# Patient Record
Sex: Female | Born: 1970 | Race: Black or African American | Hispanic: No | Smoking: Current every day smoker
Health system: Southern US, Community
[De-identification: ages and names within clinical notes are randomized; demographics above are authoritative.]

---

## 1998-11-14 ENCOUNTER — Emergency Department (HOSPITAL_COMMUNITY): Admission: EM | Admit: 1998-11-14 | Discharge: 1998-11-14 | Payer: Self-pay | Admitting: Emergency Medicine

## 2006-03-05 ENCOUNTER — Emergency Department (HOSPITAL_COMMUNITY): Admission: EM | Admit: 2006-03-05 | Discharge: 2006-03-05 | Payer: Self-pay | Admitting: Emergency Medicine

## 2006-03-09 ENCOUNTER — Ambulatory Visit: Payer: Self-pay | Admitting: Nurse Practitioner

## 2006-03-10 ENCOUNTER — Ambulatory Visit: Payer: Self-pay | Admitting: *Deleted

## 2006-05-10 ENCOUNTER — Ambulatory Visit: Payer: Self-pay | Admitting: Nurse Practitioner

## 2006-07-13 ENCOUNTER — Emergency Department (HOSPITAL_COMMUNITY): Admission: EM | Admit: 2006-07-13 | Discharge: 2006-07-13 | Payer: Self-pay | Admitting: Emergency Medicine

## 2007-04-21 IMAGING — CT CT HEAD W/O CM
1 series · 16 of 30 positions shown, 20 images · IV contrast (agent unspecified)
Comparison: None

CLINICAL DATA: Headache

HEAD CT WITHOUT CONTRAST:
TECHNIQUE: 5mm collimated images were obtained from the base of the skull
through the vertex according to standard protocol without contrast.

[Series 2: head_seq 4.5 h45s st · axial · 0.43mm/px · z∈[-193,-49]mm · 16 of 36 slices shown, 20 images]
[im 2/36  brain]
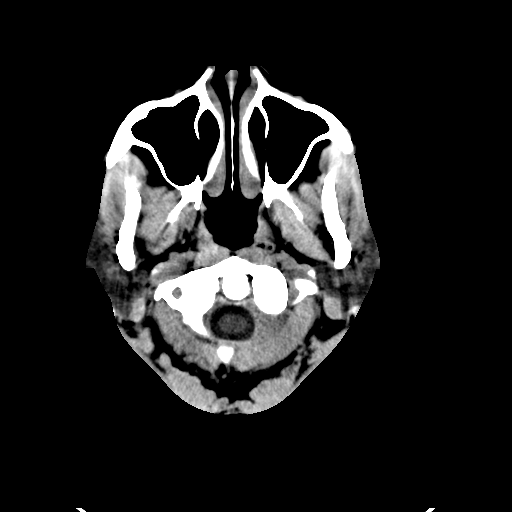
[im 2/36  bone]
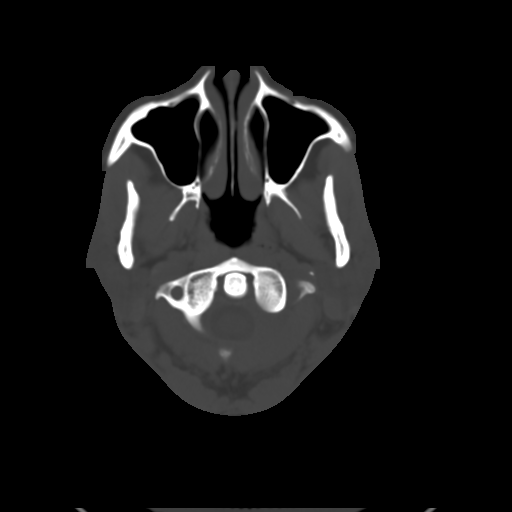
[im 4/36  brain]
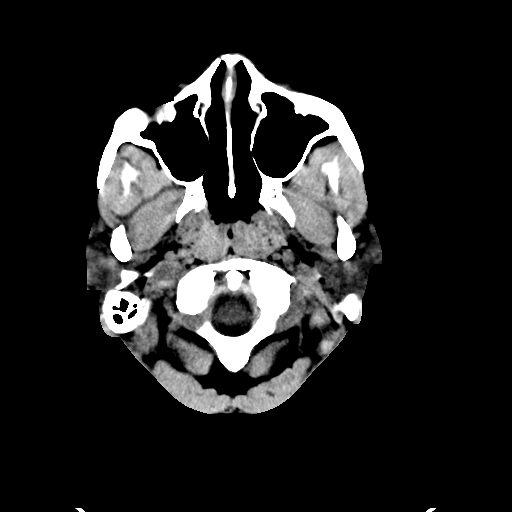
[im 7/36  brain]
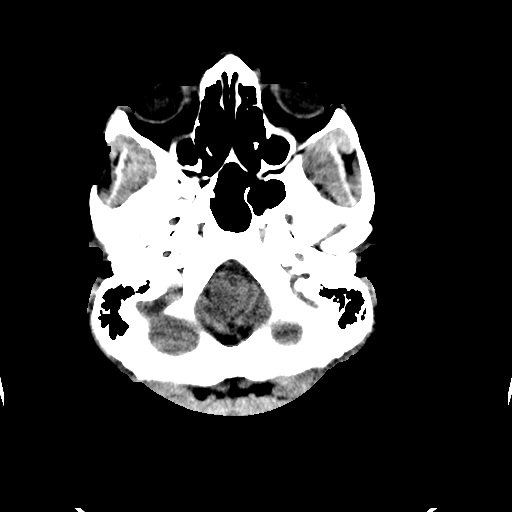
[im 9/36  brain]
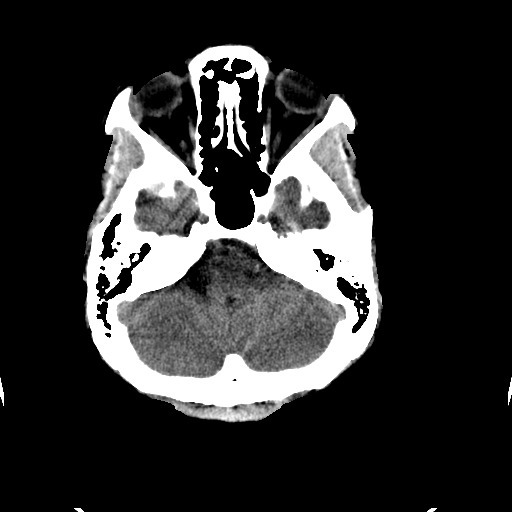
[im 10/36  brain]
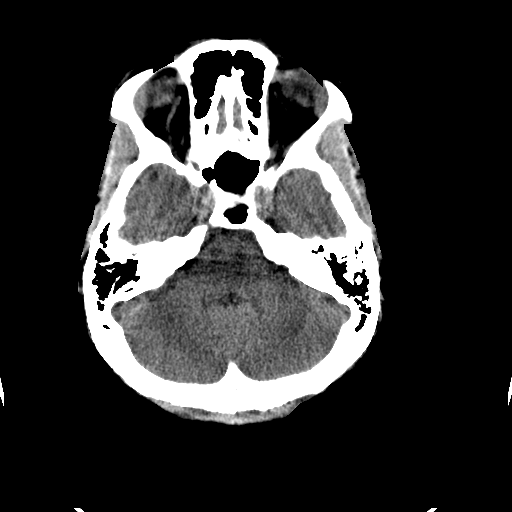
[im 10/36  bone]
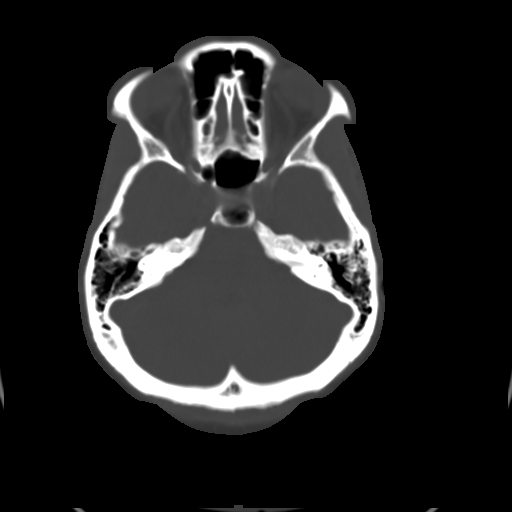
[im 13/36  brain]
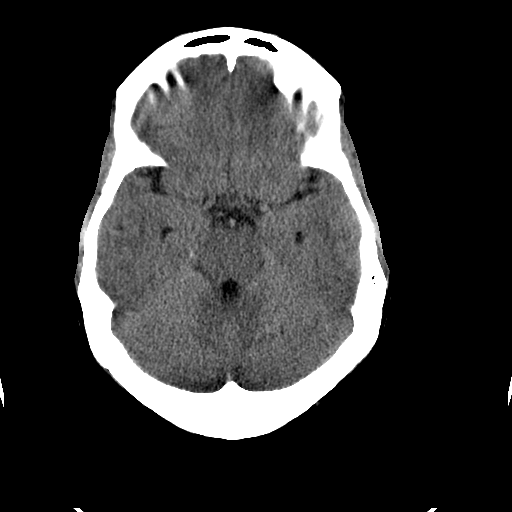
[im 15/36  brain]
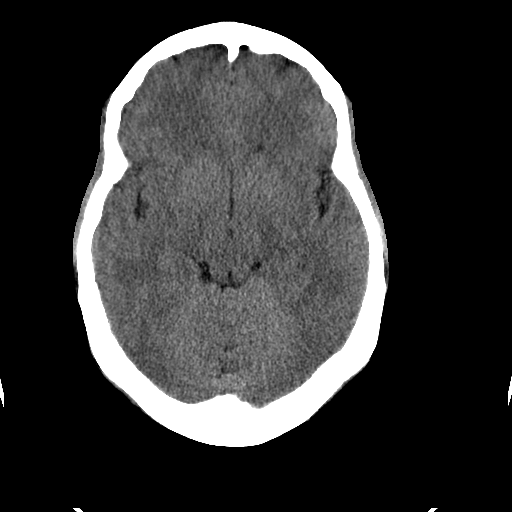
[im 17/36  brain]
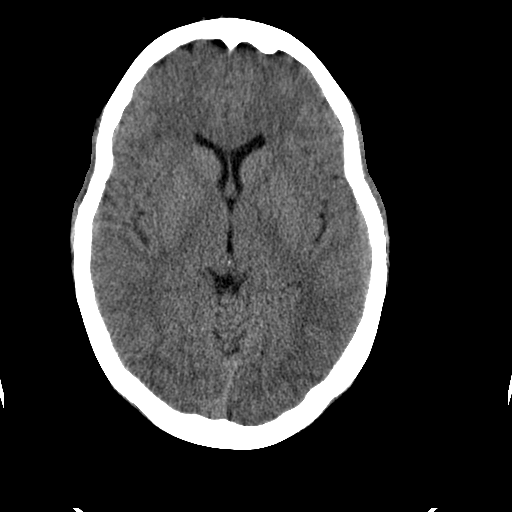
[im 19/36  brain]
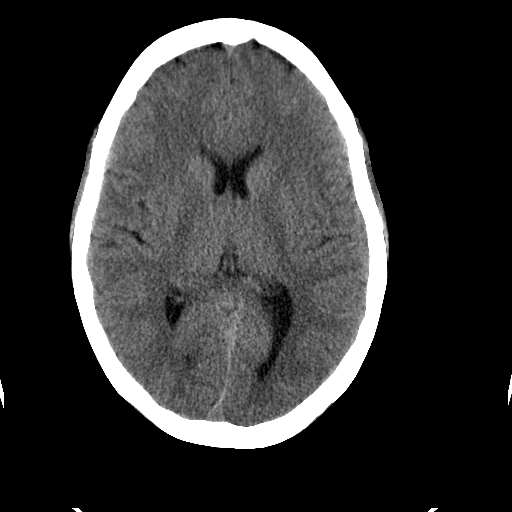
[im 19/36  bone]
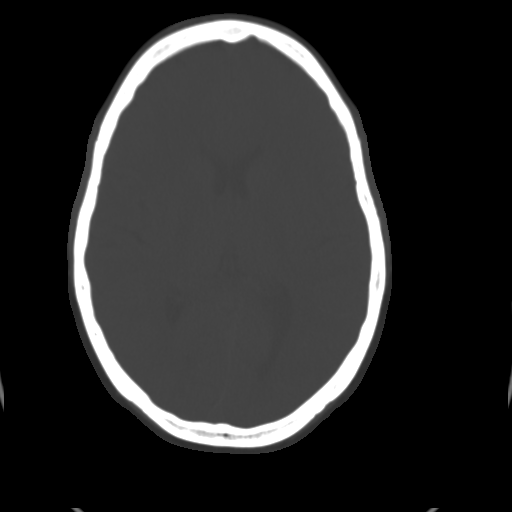
[im 21/36  brain]
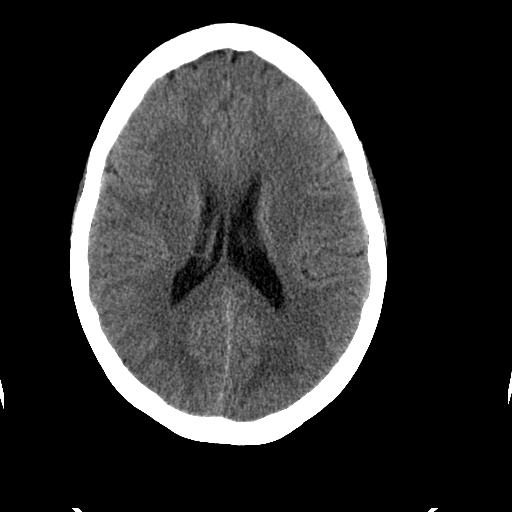
[im 23/36  brain]
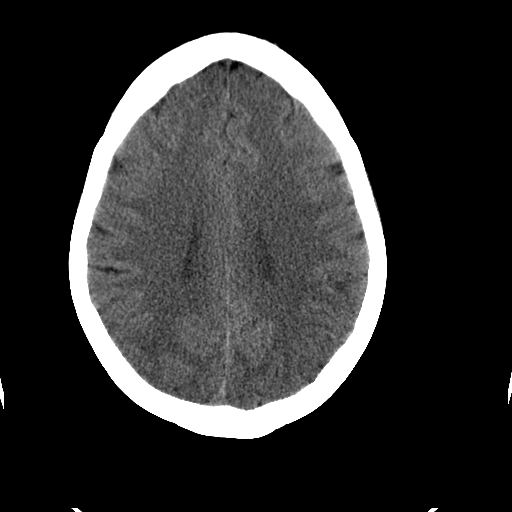
[im 26/36  brain]
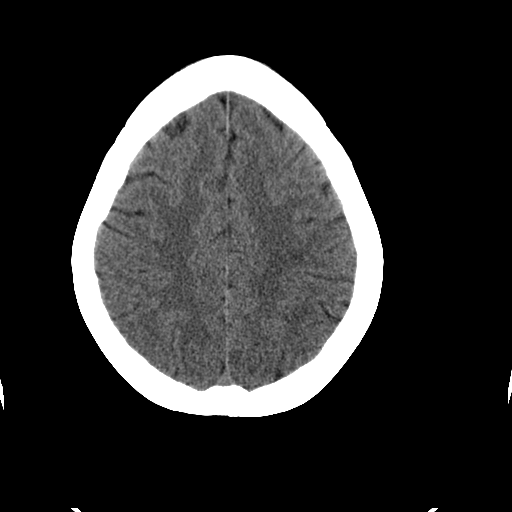
[im 27/36  brain]
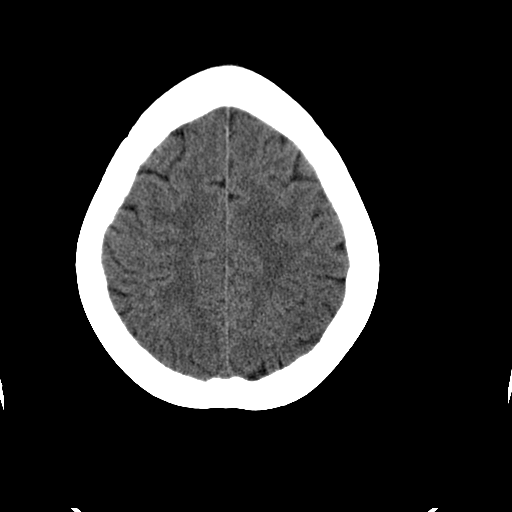
[im 27/36  bone]
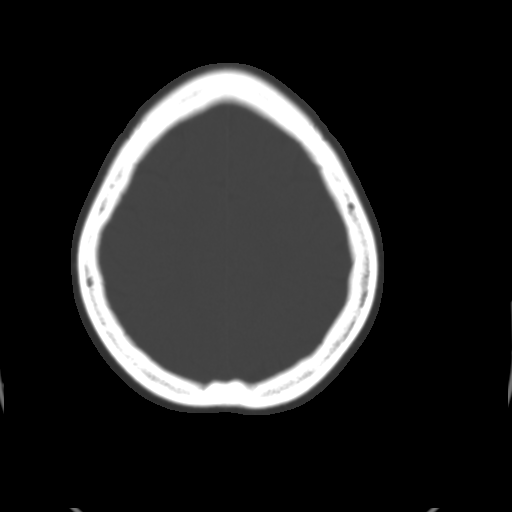
[im 29/36  brain]
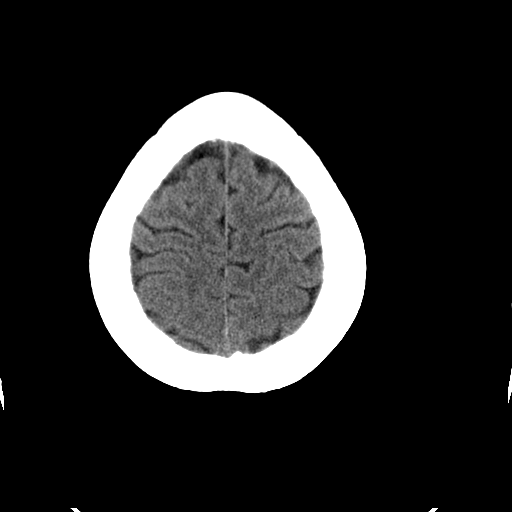
[im 32/36  brain]
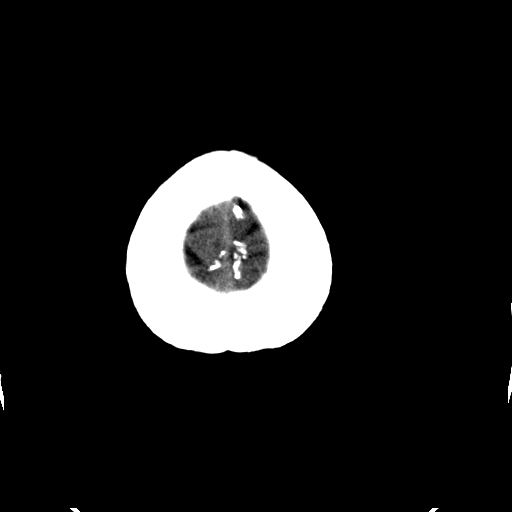
[im 34/36  brain]
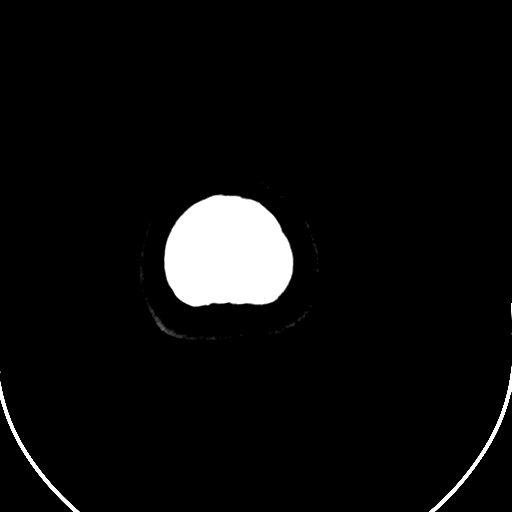

[16 of 30 positions shown; findings below may reference images not displayed]

FINDINGS: There is no evidence of intracranial hemorrhage, brain edema, or mass
effect.  No other intra-axial abnormalities are seen, and the ventricles are
within normal limits.  No abnormal extra-axial fluid collections or masses are
identified.  No skull abnormalities are noted.
IMPRESSION: No acute intracranial abnormality.
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
## 2008-02-21 ENCOUNTER — Emergency Department (HOSPITAL_COMMUNITY): Admission: EM | Admit: 2008-02-21 | Discharge: 2008-02-21 | Payer: Self-pay | Admitting: Emergency Medicine

## 2008-03-21 ENCOUNTER — Ambulatory Visit (HOSPITAL_COMMUNITY): Admission: RE | Admit: 2008-03-21 | Discharge: 2008-03-21 | Payer: Self-pay | Admitting: Chiropractic Medicine

## 2016-03-25 ENCOUNTER — Other Ambulatory Visit: Payer: Self-pay | Admitting: Physician Assistant

## 2018-10-03 DIAGNOSIS — H16141 Punctate keratitis, right eye: Secondary | ICD-10-CM | POA: Diagnosis not present

## 2018-10-03 DIAGNOSIS — H20021 Recurrent acute iridocyclitis, right eye: Secondary | ICD-10-CM | POA: Diagnosis not present

## 2018-10-03 DIAGNOSIS — H1820 Unspecified corneal edema: Secondary | ICD-10-CM | POA: Diagnosis not present

## 2018-10-06 DIAGNOSIS — H16141 Punctate keratitis, right eye: Secondary | ICD-10-CM | POA: Diagnosis not present

## 2019-04-22 DIAGNOSIS — H109 Unspecified conjunctivitis: Secondary | ICD-10-CM | POA: Diagnosis not present

## 2019-07-21 ENCOUNTER — Other Ambulatory Visit: Payer: Self-pay

## 2019-07-24 ENCOUNTER — Ambulatory Visit: Payer: BC Managed Care – PPO | Admitting: Women's Health

## 2019-07-24 ENCOUNTER — Other Ambulatory Visit: Payer: Self-pay

## 2019-07-24 ENCOUNTER — Encounter: Payer: Self-pay | Admitting: Women's Health

## 2019-07-24 VITALS — BP 128/80 | Ht 65.0 in | Wt 121.0 lb

## 2019-07-24 DIAGNOSIS — N921 Excessive and frequent menstruation with irregular cycle: Secondary | ICD-10-CM

## 2019-07-24 DIAGNOSIS — Z113 Encounter for screening for infections with a predominantly sexual mode of transmission: Secondary | ICD-10-CM

## 2019-07-24 DIAGNOSIS — Z13220 Encounter for screening for lipoid disorders: Secondary | ICD-10-CM | POA: Diagnosis not present

## 2019-07-24 DIAGNOSIS — Z01419 Encounter for gynecological examination (general) (routine) without abnormal findings: Secondary | ICD-10-CM | POA: Diagnosis not present

## 2019-07-24 DIAGNOSIS — F17200 Nicotine dependence, unspecified, uncomplicated: Secondary | ICD-10-CM | POA: Insufficient documentation

## 2019-07-24 LAB — WET PREP FOR TRICH, YEAST, CLUE

## 2019-07-24 MED ORDER — METRONIDAZOLE 500 MG PO TABS: ORAL_TABLET | ORAL | 1 refills | Status: DC

## 2019-07-24 NOTE — Patient Instructions (Addendum)
[facility]  [phone]  Dr Carlean Purl  Trichomoniasis Trichomoniasis is an STI (sexually transmitted infection) that can affect both women and men. In women, the outer area of the female genitalia (vulva) and the vagina are affected. In men, mainly the penis is affected, but the prostate and other reproductive organs can also be involved.  This condition can be treated with medicine. It often has no symptoms (is asymptomatic), especially in men. If not treated, trichomoniasis can last for months or years. What are the causes? This condition is caused by a parasite called Trichomonas vaginalis. Trichomoniasis most often spreads from person to person (is contagious) through sexual contact. What increases the risk? The following factors may make you more likely to develop this condition:  Having unprotected sex.  Having sex with a partner who has trichomoniasis.  Having multiple sexual partners.  Having had previous trichomoniasis infections or other STIs. What are the signs or symptoms? In women, symptoms of trichomoniasis include:  Abnormal vaginal discharge that is clear, white, gray, or yellow-green and foamy and has an unusual "fishy" odor.  Itching and irritation of the vagina and vulva.  Burning or pain during urination or sex.  Redness and swelling of the genitals. In men, symptoms of trichomoniasis include:  Penile discharge that may be foamy or contain pus.  Pain in the penis. This may happen only when urinating.  Itching or irritation inside the penis.  Burning after urination or ejaculation. How is this diagnosed? In women, this condition may be found during a routine Pap test or physical exam. It may be found in men during a routine physical exam. Your health care provider may do tests to help diagnose this infection, such as:  Urine tests (men and women).  The following in women: ? Testing the pH of the vagina. ? A vaginal swab test that checks for the Trichomonas  vaginalis parasite. ? Testing vaginal secretions. Your health care provider may test you for other STIs, including HIV (human immunodeficiency virus). How is this treated? This condition is treated with medicine taken by mouth (orally), such as metronidazole or tinidazole, to fight the infection. Your sexual partner(s) also need to be tested and treated.  If you are a woman and you plan to become pregnant or think you may be pregnant, tell your health care provider right away. Some medicines that are used to treat the infection should not be taken during pregnancy. Your health care provider may recommend over-the-counter medicines or creams to help relieve itching or irritation. You may be tested for infection again 3 months after treatment. Follow these instructions at home:  Take and use over-the-counter and prescription medicines, including creams, only as told by your health care provider.  Take your antibiotic medicine as told by your health care provider. Do not stop taking the antibiotic even if you start to feel better.  Do not have sex until 7-10 days after you finish your medicine, or until your health care provider approves. Ask your health care provider when you may start to have sex again.  (Women) Do not douche or wear tampons while you have the infection.  Discuss your infection with your sexual partner(s). Make sure that your partner gets tested and treated, if necessary.  Keep all follow-up visits as told by your health care provider. This is important. How is this prevented?   Use condoms every time you have sex. Using condoms correctly and consistently can help protect against STIs.  Avoid having multiple sexual partners.  Talk with your sexual partner about any symptoms that either of you may have, as well as any history of STIs.  Get tested for STIs and STDs (sexually transmitted diseases) before you have sex. Ask your partner to do the same.  Do not have sexual  contact if you have symptoms of trichomoniasis or another STI. Contact a health care provider if:  You still have symptoms after you finish your medicine.  You develop pain in your abdomen.  You have pain when you urinate.  You have bleeding after sex.  You develop a rash.  You feel nauseous or you vomit.  You plan to become pregnant or think you may be pregnant. Summary  Trichomoniasis is an STI (sexually transmitted infection) that can affect both women and men.  This condition often has no symptoms (is asymptomatic), especially in men.  Without treatment, this condition can last for months or years.  You should not have sex until 7-10 days after you finish your medicine, or until your health care provider approves. Ask your health care provider when you may start to have sex again.  Discuss your infection with your sexual partner(s). Make sure that your partner gets tested and treated, if necessary. This information is not intended to replace advice given to you by your health care provider. Make sure you discuss any questions you have with your health care provider. Document Released: 03/17/2001 Document Revised: 07/05/2018 Document Reviewed: 07/05/2018 Elsevier Patient Education  2020 [employer].  Health Maintenance, Female Adopting a healthy lifestyle and getting preventive care are important in promoting health and wellness. Ask your health care provider about:  The right schedule for you to have regular tests and exams.  Things you can do on your own to prevent diseases and keep yourself healthy. What should I know about diet, weight, and exercise? Eat a healthy diet   Eat a diet that includes plenty of vegetables, fruits, low-fat dairy products, and lean protein.  Do not eat a lot of foods that are high in solid fats, added sugars, or sodium. Maintain a healthy weight Body mass index (BMI) is used to identify weight problems. It estimates body fat based on  height and weight. Your health care provider can help determine your BMI and help you achieve or maintain a healthy weight. Get regular exercise Get regular exercise. This is one of the most important things you can do for your health. Most adults should:  Exercise for at least 150 minutes each week. The exercise should increase your heart rate and make you sweat (moderate-intensity exercise).  Do strengthening exercises at least twice a week. This is in addition to the moderate-intensity exercise.  Spend less time sitting. Even light physical activity can be beneficial. Watch cholesterol and blood lipids Have your blood tested for lipids and cholesterol at 49 years of age, then have this test every 5 years. Have your cholesterol levels checked more often if:  Your lipid or cholesterol levels are high.  You are older than 49 years of age.  You are at high risk for heart disease. What should I know about cancer screening? Depending on your health history and family history, you may need to have cancer screening at various ages. This may include screening for:  Breast cancer.  Cervical cancer.  Colorectal cancer.  Skin cancer.  Lung cancer. What should I know about heart disease, diabetes, and high blood pressure? Blood pressure and heart disease  High blood pressure causes heart disease and  increases the risk of stroke. This is more likely to develop in people who have high blood pressure readings, are of African descent, or are overweight.  Have your blood pressure checked: ? Every 3-5 years if you are 6518-49 years of age. ? Every year if you are 49 years old or older. Diabetes Have regular diabetes screenings. This checks your fasting blood sugar level. Have the screening done:  Once every three years after age 49 if you are at a normal weight and have a low risk for diabetes.  More often and at a younger age if you are overweight or have a high risk for diabetes. What  should I know about preventing infection? Hepatitis B If you have a higher risk for hepatitis B, you should be screened for this virus. Talk with your health care provider to find out if you are at risk for hepatitis B infection. Hepatitis C Testing is recommended for:  Everyone born from [id number] through 1966.  Anyone with known risk factors for hepatitis C. Sexually transmitted infections (STIs)  Get screened for STIs, including gonorrhea and chlamydia, if: ? You are sexually active and are younger than 49 years of age. ? You are older than 49 years of age and your health care provider tells you that you are at risk for this type of infection. ? Your sexual activity has changed since you were last screened, and you are at increased risk for chlamydia or gonorrhea. Ask your health care provider if you are at risk.  Ask your health care provider about whether you are at high risk for HIV. Your health care provider may recommend a prescription medicine to help prevent HIV infection. If you choose to take medicine to prevent HIV, you should first get tested for HIV. You should then be tested every 3 months for as long as you are taking the medicine. Pregnancy  If you are about to stop having your period (premenopausal) and you may become pregnant, seek counseling before you get pregnant.  Take 400 to 800 micrograms (mcg) of folic acid every day if you become pregnant.  Ask for birth control (contraception) if you want to prevent pregnancy. Osteoporosis and menopause Osteoporosis is a disease in which the bones lose minerals and strength with aging. This can result in bone fractures. If you are 49 years old or older, or if you are at risk for osteoporosis and fractures, ask your health care provider if you should:  Be screened for bone loss.  Take a calcium or vitamin D supplement to lower your risk of fractures.  Be given hormone replacement therapy (HRT) to treat symptoms of  menopause. Follow these instructions at home: Lifestyle  Do not use any products that contain nicotine or tobacco, such as cigarettes, e-cigarettes, and chewing tobacco. If you need help quitting, ask your health care provider.  Do not use street drugs.  Do not share needles.  Ask your health care provider for help if you need support or information about quitting drugs. Alcohol use  Do not drink alcohol if: ? Your health care provider tells you not to drink. ? You are pregnant, may be pregnant, or are planning to become pregnant.  If you drink alcohol: ? Limit how much you use to 0-1 drink a day. ? Limit intake if you are breastfeeding.  Be aware of how much alcohol is in your drink. In the [place] one drink equals one 12 oz bottle of beer (355 mL), one 5 oz  glass of wine (148 mL), or one 1 oz glass of hard liquor (44 mL). General instructions  Schedule regular health, dental, and eye exams.  Stay current with your vaccines.  Tell your health care provider if: ? You often feel depressed. ? You have ever been abused or do not feel safe at home. Summary  Adopting a healthy lifestyle and getting preventive care are important in promoting health and wellness.  Follow your health care provider's instructions about healthy diet, exercising, and getting tested or screened for diseases.  Follow your health care provider's instructions on monitoring your cholesterol and blood pressure. This information is not intended to replace advice given to you by your health care provider. Make sure you discuss any questions you have with your health care provider. Document Released: 04/06/2011 Document Revised: 09/14/2018 Document Reviewed: 09/14/2018 Elsevier Patient Education  2020 [employer].

## 2019-07-24 NOTE — Progress Notes (Signed)
Kristin Long 27-Nov-1970 [id number]    History:    Presents for annual exam.  Regular monthly 5 to 6-day cycle until September cycle lasted 2 weeks.  BTL.  Same partner for about 2 years.  Reports normal Pap history but has not had one for years.  Has not had a screening mammogram.  Smokes less than 1/2 pack daily.  Past medical history, past surgical history, family history and social history were all reviewed and documented in the EPIC chart.  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  3 children all in their 41s has 7 grandchildren all doing well.  Daughter lives in [place] 5 sons, 1 set of twins, son lives in [place] twin daughters.  Mother diabetes.  Originally from [place].  ROS:  A ROS was performed and pertinent positives and negatives are included.  Exam:  Vitals:   07/24/19 1147  BP: 128/80  Weight: 121 lb (54.9 kg)  Height: 5\' 5"  (1.651 m)   Body mass index is 20.14 kg/m.   General appearance:  Normal Thyroid:  Symmetrical, normal in size, without palpable masses or nodularity. Respiratory  Auscultation:  Clear without wheezing or rhonchi Cardiovascular  Auscultation:  Regular rate, without rubs, murmurs or gallops  Edema/varicosities:  Not grossly evident Abdominal  Soft,nontender, without masses, guarding or rebound.  Liver/spleen:  No organomegaly noted  Hernia:  None appreciated  Skin  Inspection:  Grossly normal   Breasts: Examined lying and sitting.     Right: Without masses, retractions, discharge or axillary adenopathy.     Left: Without masses, retractions, discharge or axillary adenopathy. Gentitourinary   Inguinal/mons:  Normal without inguinal adenopathy  External genitalia:  Normal  BUS/Urethra/Skene's glands:  Normal  Vagina:  Normal bloody discharge with odor wet prep positive for trichomonas  Cervix:  Normal  Uterus:   normal in size, shape and contour.  Midline and mobile  Adnexa/parametria:     Rt: Without masses or tenderness.   Lt: Without masses  or tenderness.  Anus and perineum: Normal  Digital rectal exam: Normal sphincter tone without palpated masses or tenderness  Assessment/Plan:  48 y.o. DBF G4, P3 for annual exam with complaint of 2-week cycle in September.  Regular monthly 5 to 7-day cycles until September/BTL Trichomonas STD screen Smoker  Plan: Trichomonas discussed, Flagyl 2 g p.o. today, refill on prescription for partner to take.  Instructed to call if continued irregular bleeding.  SBEs, annual screening mammogram breast center information given instructed to schedule.  Encouraged regular exercise, vitamin D 1000 daily.  Aware of hazards of smoking, ways to decrease discussed.  CBC, CMP, lipid panel, TSH, Pap with HR HPV typing, GC/chlamydia, HIV, RPR.    [facility], 12:15 PM 07/24/2019

## 2019-07-25 LAB — PAP, TP IMAGING W/ HPV RNA, RFLX HPV TYPE 16,18/45: HPV DNA High Risk: NOT DETECTED

## 2019-07-25 LAB — CBC WITH DIFFERENTIAL/PLATELET
Absolute Monocytes: 367 cells/uL (ref 200–950)
Basophils Absolute: 31 cells/uL (ref 0–200)
Basophils Relative: 0.8 %
Eosinophils Absolute: 109 cells/uL (ref 15–500)
Eosinophils Relative: 2.8 %
HCT: 28 % — ABNORMAL LOW (ref 35.0–45.0)
Hemoglobin: 8.3 g/dL — ABNORMAL LOW (ref 11.7–15.5)
Lymphs Abs: 1810 cells/uL (ref 850–3900)
MCH: 21.2 pg — ABNORMAL LOW (ref 27.0–33.0)
MCHC: 29.6 g/dL — ABNORMAL LOW (ref 32.0–36.0)
MCV: 71.4 fL — ABNORMAL LOW (ref 80.0–100.0)
MPV: 9.9 fL (ref 7.5–12.5)
Monocytes Relative: 9.4 %
Neutro Abs: 1583 cells/uL (ref 1500–7800)
Neutrophils Relative %: 40.6 %
Platelets: 291 10*3/uL (ref 140–400)
RBC: 3.92 10*6/uL (ref 3.80–5.10)
RDW: 16.2 % — ABNORMAL HIGH (ref 11.0–15.0)
Total Lymphocyte: 46.4 %
WBC: 3.9 10*3/uL (ref 3.8–10.8)

## 2019-07-25 LAB — COMPREHENSIVE METABOLIC PANEL
AG Ratio: 1.4 (calc) (ref 1.0–2.5)
ALT: 24 U/L (ref 6–29)
AST: 29 U/L (ref 10–35)
Albumin: 4.2 g/dL (ref 3.6–5.1)
Alkaline phosphatase (APISO): 72 U/L (ref 31–125)
BUN: 8 mg/dL (ref 7–25)
CO2: 23 mmol/L (ref 20–32)
Calcium: 9.2 mg/dL (ref 8.6–10.2)
Chloride: 106 mmol/L (ref 98–110)
Creat: 0.68 mg/dL (ref 0.50–1.10)
Globulin: 2.9 g/dL (calc) (ref 1.9–3.7)
Glucose, Bld: 76 mg/dL (ref 65–99)
Potassium: 4 mmol/L (ref 3.5–5.3)
Sodium: 138 mmol/L (ref 135–146)
Total Bilirubin: 0.6 mg/dL (ref 0.2–1.2)
Total Protein: 7.1 g/dL (ref 6.1–8.1)

## 2019-07-25 LAB — LIPID PANEL
Cholesterol: 218 mg/dL — ABNORMAL HIGH (ref <200)
HDL: 75 mg/dL (ref >=50)
LDL Cholesterol (Calc): 120 mg/dL (calc) — ABNORMAL HIGH
Non-HDL Cholesterol (Calc): 143 mg/dL (calc) — ABNORMAL HIGH (ref <130)
Total CHOL/HDL Ratio: 2.9 (calc) (ref <5.0)
Triglycerides: 123 mg/dL (ref <150)

## 2019-07-25 LAB — C. TRACHOMATIS/N. GONORRHOEAE RNA
C. trachomatis RNA, TMA: NOT DETECTED
N. gonorrhoeae RNA, TMA: NOT DETECTED

## 2019-07-25 LAB — TSH: TSH: 1.67 mIU/L

## 2019-07-25 LAB — HIV ANTIBODY (ROUTINE TESTING W REFLEX): HIV 1&2 Ab, 4th Generation: NONREACTIVE

## 2019-07-25 LAB — RPR: RPR Ser Ql: NONREACTIVE

## 2019-07-27 ENCOUNTER — Emergency Department (HOSPITAL_COMMUNITY)
Admission: EM | Admit: 2019-07-27 | Discharge: 2019-07-27 | Disposition: A | Discharge status: Home / Self Care | Payer: BC Managed Care – PPO | Attending: Emergency Medicine | Admitting: Emergency Medicine

## 2019-07-27 ENCOUNTER — Other Ambulatory Visit: Payer: Self-pay

## 2019-07-27 ENCOUNTER — Encounter (HOSPITAL_COMMUNITY): Payer: Self-pay | Admitting: Emergency Medicine

## 2019-07-27 ENCOUNTER — Emergency Department (HOSPITAL_COMMUNITY): Payer: BC Managed Care – PPO

## 2019-07-27 DIAGNOSIS — M549 Dorsalgia, unspecified: Secondary | ICD-10-CM | POA: Diagnosis not present

## 2019-07-27 DIAGNOSIS — Z79899 Other long term (current) drug therapy: Secondary | ICD-10-CM | POA: Insufficient documentation

## 2019-07-27 DIAGNOSIS — F17200 Nicotine dependence, unspecified, uncomplicated: Secondary | ICD-10-CM | POA: Diagnosis not present

## 2019-07-27 DIAGNOSIS — M25519 Pain in unspecified shoulder: Secondary | ICD-10-CM | POA: Insufficient documentation

## 2019-07-27 DIAGNOSIS — R109 Unspecified abdominal pain: Secondary | ICD-10-CM | POA: Diagnosis not present

## 2019-07-27 LAB — URINALYSIS, ROUTINE W REFLEX MICROSCOPIC
Bilirubin Urine: NEGATIVE
Glucose, UA: NEGATIVE mg/dL
Hgb urine dipstick: NEGATIVE
Ketones, ur: NEGATIVE mg/dL
Leukocytes,Ua: NEGATIVE
Nitrite: NEGATIVE
Protein, ur: NEGATIVE mg/dL
Specific Gravity, Urine: 1.008 (ref 1.005–1.030)
pH: 6 (ref 5.0–8.0)

## 2019-07-27 MED ORDER — KETOROLAC TROMETHAMINE 30 MG/ML IJ SOLN
60.0000 mg | Freq: Once | INTRAMUSCULAR | Status: AC
  Administered 2019-07-27: 19:00:00 60 mg via INTRAMUSCULAR
  Filled 2019-07-27: qty 2

## 2019-07-27 MED ORDER — TRAMADOL HCL 50 MG PO TABS: 50.0000 mg | ORAL_TABLET | Freq: Four times a day (QID) | ORAL | 0 refills | Status: DC | PRN

## 2019-07-27 NOTE — ED Provider Notes (Signed)
[facility]-EMERGENCY DEPT Provider Note   CSN: [id number] Arrival date & time: 07/27/19  1202     History   Chief Complaint Chief Complaint  Patient presents with  . Flank Pain  . Vomiting  . Shoulder Pain    HPI Kristin Long is a 48 y.o. female.     Patient complains of upper back and flank pain.  Patient states is worse with movement  The history is provided by the patient. No language interpreter was used.  Flank Pain This is a new problem. The current episode started 2 days ago. The problem occurs constantly. The problem has not changed since onset.Pertinent negatives include no chest pain, no abdominal pain and no headaches. The symptoms are aggravated by bending. Nothing relieves the symptoms. She has tried nothing for the symptoms.  Shoulder Pain Associated symptoms: no back pain and no fatigue     History reviewed. No pertinent past medical history.  Patient Active Problem List   Diagnosis Date Noted  . Current every day smoker 07/24/2019    History reviewed. No pertinent surgical history.   OB History    Gravida  4   Para      Term      Preterm      AB  1   Living  3     SAB  1   TAB      Ectopic  0   Multiple      Live Births               Home Medications    Prior to Admission medications   Medication Sig Start Date End Date Taking? Authorizing Provider  metroNIDAZOLE (FLAGYL) 500 MG tablet Take 2 tablets with dinner and 2 at bedtime with food 07/24/19   Harrington Challenger, NP  traMADol (ULTRAM) 50 MG tablet Take 1 tablet (50 mg total) by mouth every 6 (six) hours as needed. 07/27/19   Bethann Berkshire, MD  VITAMIN E PO Take 1 tablet by mouth daily.    [provider]    Family History Family History  Problem Relation Age of Onset  . Diabetes Mother     Social History Social History   Tobacco Use  . Smoking status: Current Every Day Smoker  . Smokeless tobacco: Never Used  Substance Use  Topics  . Alcohol use: Yes  . Drug use: Yes    Types: Marijuana     Allergies   Patient has no known allergies.   Review of Systems Review of Systems  Constitutional: Negative for appetite change and fatigue.  HENT: Negative for congestion, ear discharge and sinus pressure.   Eyes: Negative for discharge.  Respiratory: Negative for cough.   Cardiovascular: Negative for chest pain.  Gastrointestinal: Negative for abdominal pain and diarrhea.  Genitourinary: Positive for flank pain. Negative for frequency and hematuria.  Musculoskeletal: Negative for back pain.  Skin: Negative for rash.  Neurological: Negative for seizures and headaches.  Psychiatric/Behavioral: Negative for hallucinations.     Physical Exam Updated Vital Signs BP 123/88   Pulse (!) 115   Temp 98.5 F (36.9 C) (Oral)   Resp 18   LMP 07/16/2019   SpO2 99%   Physical Exam Vitals signs and nursing note reviewed.  Constitutional:      Appearance: She is well-developed.  HENT:     Head: Normocephalic.     Nose: Nose normal.  Eyes:     General: No scleral icterus.  Conjunctiva/sclera: Conjunctivae normal.  Neck:     Musculoskeletal: Neck supple.     Thyroid: No thyromegaly.  Cardiovascular:     Rate and Rhythm: Normal rate and regular rhythm.     Heart sounds: No murmur. No friction rub. No gallop.   Pulmonary:     Breath sounds: No stridor. No wheezing or rales.  Chest:     Chest wall: No tenderness.  Abdominal:     General: There is no distension.     Tenderness: There is no abdominal tenderness. There is no rebound.  Musculoskeletal: Normal range of motion.     Comments: Tender right flank  Lymphadenopathy:     Cervical: No cervical adenopathy.  Skin:    Findings: No erythema or rash.  Neurological:     Mental Status: She is oriented to person, place, and time.     Motor: No abnormal muscle tone.     Coordination: Coordination normal.  Psychiatric:        Behavior: Behavior normal.       ED Treatments / Results  Labs (all labs ordered are listed, but only abnormal results are displayed) Labs Reviewed  URINALYSIS, ROUTINE W REFLEX MICROSCOPIC    EKG None  Radiology Dg Chest 2 View  Result Date: 07/27/2019 CLINICAL DATA:  Right-sided chest pain EXAM: CHEST - 2 VIEW COMPARISON:  03/05/2006 FINDINGS: The heart size and mediastinal contours are within normal limits. Both lungs are clear. Multiple healed right-sided rib fractures. No acute osseous findings. IMPRESSION: No active cardiopulmonary disease. Electronically Signed   By: Davina Poke M.D.   On: 07/27/2019 18:16    Procedures Procedures (including critical care time)  Medications Ordered in ED Medications  ketorolac (TORADOL) 30 MG/ML injection 60 mg (has no administration in time range)     Initial Impression / Assessment and Plan / ED Course  I have reviewed the triage vital signs and the nursing notes.  Pertinent labs & imaging results that were available during my care of the patient were reviewed by me and considered in my medical decision making (see chart for details).    Patient had CBC and chemistries a couple days ago and she did not want them repeated.  I reviewed the labs and she is anemic and noticed that.  Urinalysis negative x-ray negative.  Suspect muscle discomfort.  Patient given Ultram and told to take Tylenol Motrin and follow-up with her doctor     Final Clinical Impressions(s) / ED Diagnoses   Final diagnoses:  Right flank pain    ED Discharge Orders         Ordered    traMADol (ULTRAM) 50 MG tablet  Every 6 hours PRN     07/27/19 Rayford Halsted, MD 07/27/19 1909

## 2019-07-27 NOTE — Discharge Instructions (Addendum)
Follow-up with your family doctor next week if not improving.  Take the Ultram if Tylenol or Motrin does not help with the discomfort

## 2020-09-11 IMAGING — CR DG CHEST 2V
2 series · 2 of 2 positions shown · non-contrast
Comparison: 03/05/2006

CLINICAL DATA: Right-sided chest pain

EXAM:
CHEST - 2 VIEW

[w chest pa]
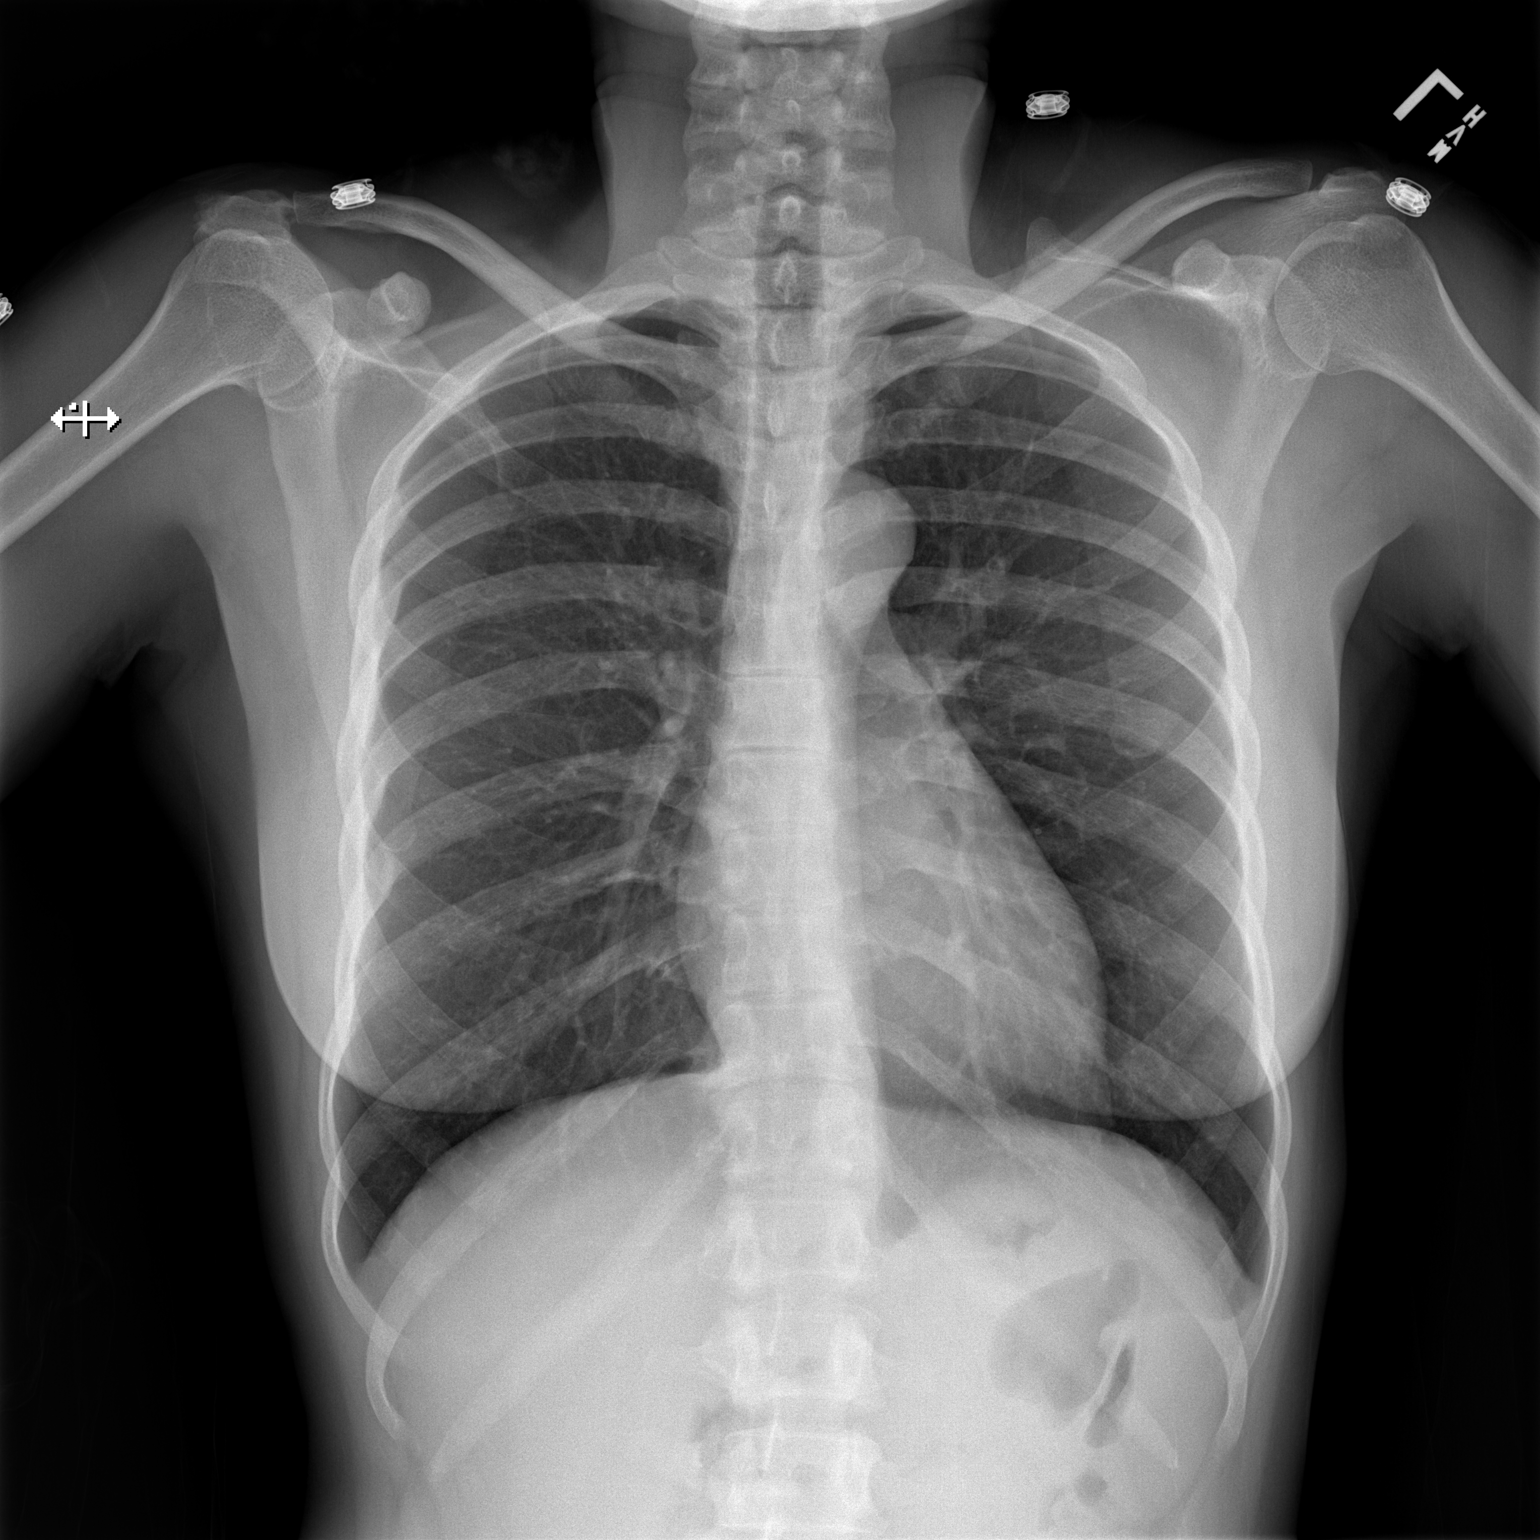

[w chest lat]
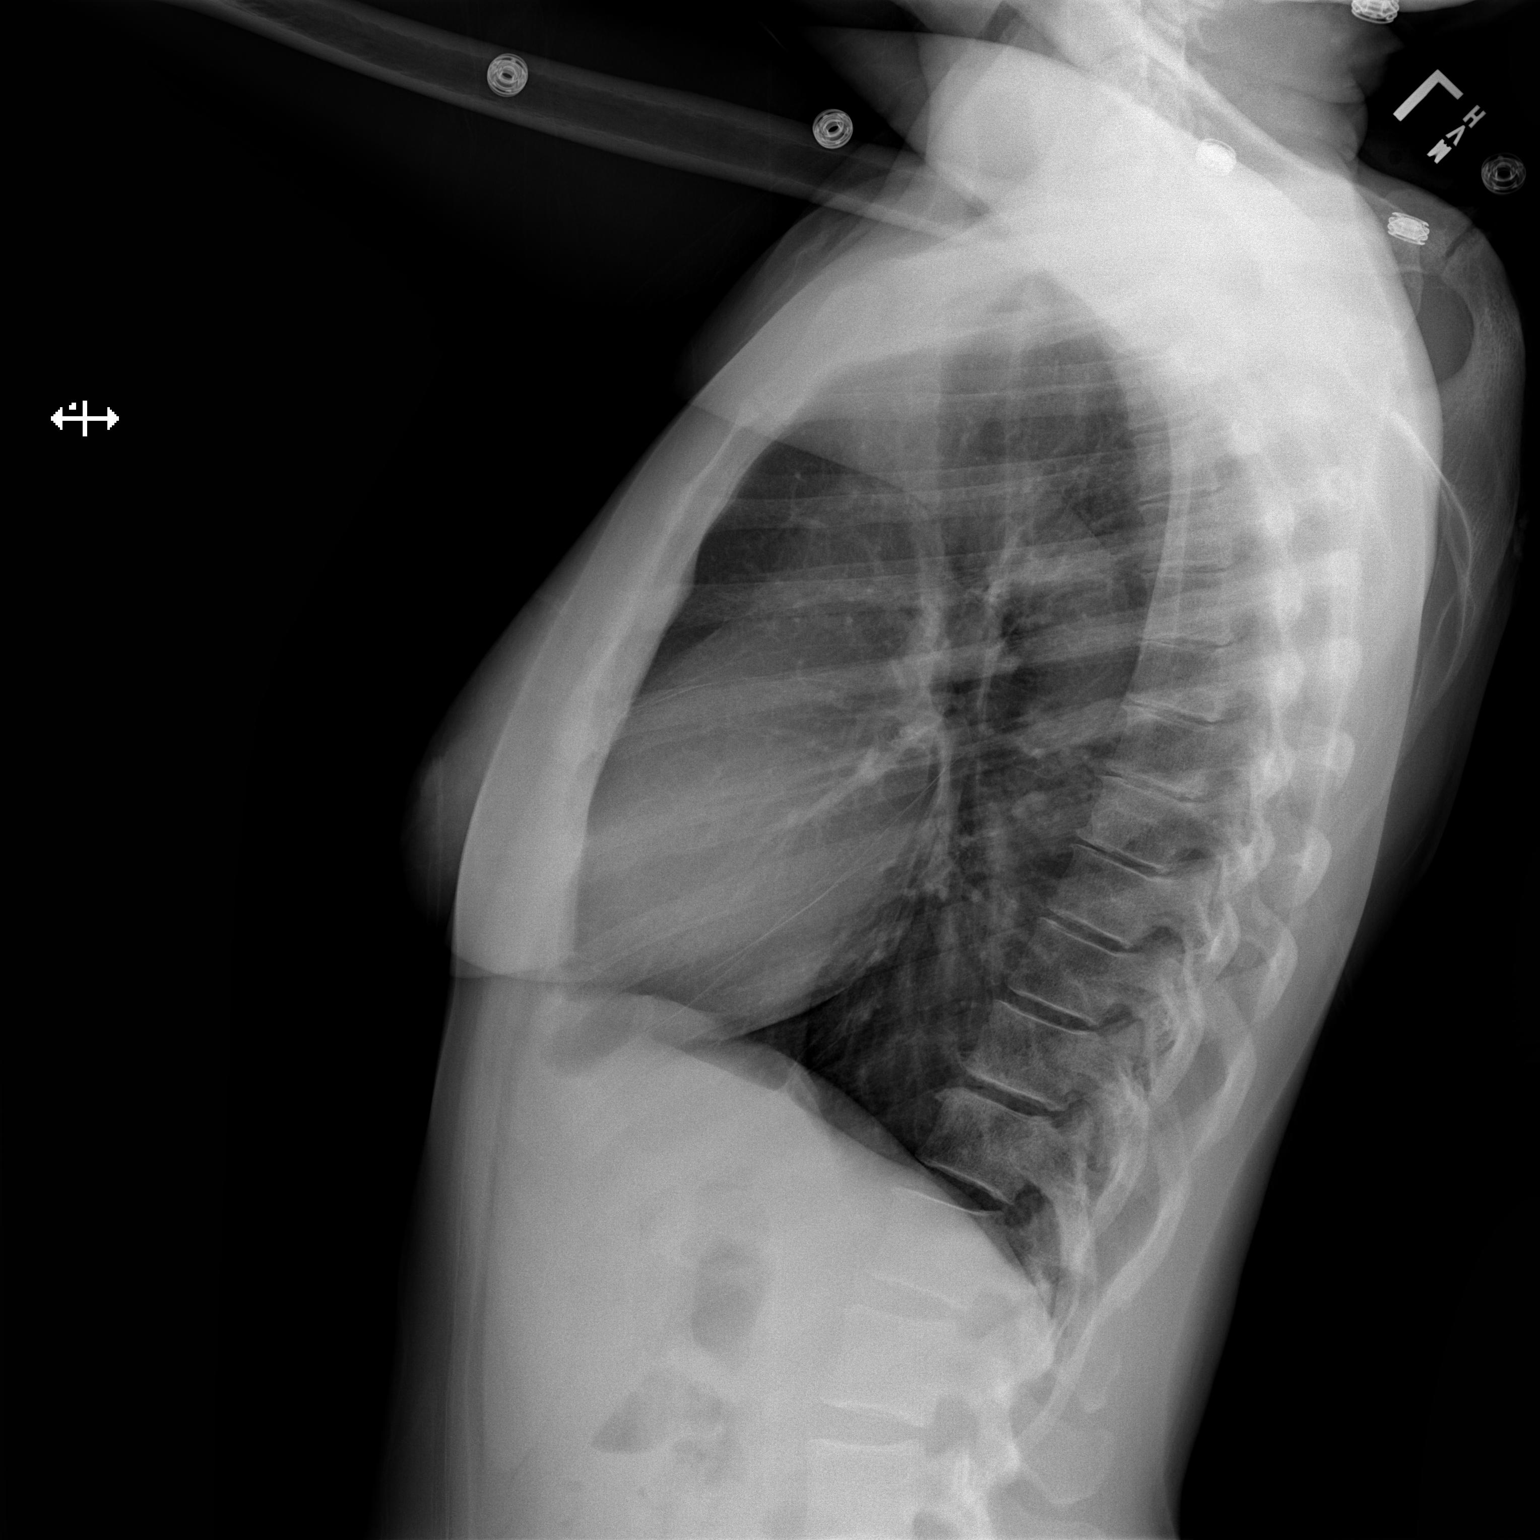

[2 of 2 positions shown; findings below may reference images not displayed]

FINDINGS: The heart size and mediastinal contours are within normal limits.
Both lungs are clear. Multiple healed right-sided rib fractures. No
acute osseous findings.
IMPRESSION: No active cardiopulmonary disease.

## 2022-04-22 ENCOUNTER — Emergency Department (HOSPITAL_BASED_OUTPATIENT_CLINIC_OR_DEPARTMENT_OTHER)
Admission: EM | Admit: 2022-04-22 | Discharge: 2022-04-23 | Disposition: A | Discharge status: Home / Self Care | Payer: Self-pay | Attending: Emergency Medicine | Admitting: Emergency Medicine

## 2022-04-22 ENCOUNTER — Other Ambulatory Visit: Payer: Self-pay

## 2022-04-22 ENCOUNTER — Emergency Department (HOSPITAL_BASED_OUTPATIENT_CLINIC_OR_DEPARTMENT_OTHER): Payer: Self-pay | Admitting: Radiology

## 2022-04-22 ENCOUNTER — Encounter (HOSPITAL_BASED_OUTPATIENT_CLINIC_OR_DEPARTMENT_OTHER): Payer: Self-pay

## 2022-04-22 DIAGNOSIS — S2232XA Fracture of one rib, left side, initial encounter for closed fracture: Secondary | ICD-10-CM | POA: Insufficient documentation

## 2022-04-22 DIAGNOSIS — F17200 Nicotine dependence, unspecified, uncomplicated: Secondary | ICD-10-CM | POA: Insufficient documentation

## 2022-04-22 DIAGNOSIS — W1789XA Other fall from one level to another, initial encounter: Secondary | ICD-10-CM | POA: Insufficient documentation

## 2022-04-22 DIAGNOSIS — M79605 Pain in left leg: Secondary | ICD-10-CM | POA: Insufficient documentation

## 2022-04-22 DIAGNOSIS — M79602 Pain in left arm: Secondary | ICD-10-CM | POA: Insufficient documentation

## 2022-04-22 DIAGNOSIS — Y990 Civilian activity done for income or pay: Secondary | ICD-10-CM | POA: Insufficient documentation

## 2022-04-22 LAB — PREGNANCY, URINE: Preg Test, Ur: NEGATIVE

## 2022-04-22 MED ORDER — NAPROXEN 500 MG PO TABS: 500.0000 mg | ORAL_TABLET | Freq: Two times a day (BID) | ORAL | 0 refills | Status: DC

## 2022-04-22 MED ORDER — LIDOCAINE 5 % EX PTCH: 1.0000 | MEDICATED_PATCH | CUTANEOUS | 0 refills | Status: AC

## 2022-04-22 MED ORDER — OXYCODONE HCL 5 MG PO TABS: 5.0000 mg | ORAL_TABLET | ORAL | 0 refills | Status: AC | PRN

## 2022-04-22 NOTE — Discharge Instructions (Signed)
You were evaluated in the Emergency Department and after careful evaluation, we did not find any emergent condition requiring admission or further testing in the hospital.  Your exam/testing today is overall reassuring.  X-rays show that you likely have a broken rib.  Recommend using the Naprosyn twice daily for pain.  Can also use the numbing patches daily.  Use the oxycodone for more significant pain.  Take deep breathes throughout the day.  Please return to the Emergency Department if you experience any worsening of your condition.   Thank you for allowing [place] to be a part of your care.

## 2022-04-22 NOTE — ED Provider Notes (Signed)
[facility] EMERGENCY [facility] Emergency Department Provider Note MRN:  [id number]  Arrival date & time: 04/22/22     Chief Complaint   Fall   History of Present Illness   Kristin Long is a 51 y.o. year-old female with no pertinent past medical history presenting to the ED with chief complaint of fall.  Patient fell into a grease trap at work, sustained trauma to the left side of her body.  Endorsing pain to left arm, left leg, left ribs.  No head trauma, no loss of consciousness, no abdominal pain, no chest pain or shortness of breath.  Review of Systems  A thorough review of systems was obtained and all systems are negative except as noted in the HPI and PMH.   Patient's Health History   History reviewed. No pertinent past medical history.  History reviewed. No pertinent surgical history.  Family History  Problem Relation Age of Onset   Diabetes Mother     Social History   Socioeconomic History   Marital status: Legally Separated    Spouse name: Not on file   Number of children: Not on file   Years of education: Not on file   Highest education level: Not on file  Occupational History   Not on file  Tobacco Use   Smoking status: Every Day   Smokeless tobacco: Never  Substance and Sexual Activity   Alcohol use: Yes   Drug use: Yes    Types: Marijuana   Sexual activity: Yes    Birth control/protection: None, Surgical    Comment: declined insurance questions  Other Topics Concern   Not on file  Social History Narrative   Not on file   Social Determinants of Health   Financial Resource Strain: Not on file  Food Insecurity: Not on file  Transportation Needs: Not on file  Physical Activity: Not on file  Stress: Not on file  Social Connections: Not on file  Intimate Partner Violence: Not on file     Physical Exam   Vitals:   04/22/22 2230 04/22/22 2300  BP: (!) 122/95 (!) 142/91  Pulse: 87 80  Resp: 16 17  Temp:    SpO2: 100% 100%     CONSTITUTIONAL: Well-appearing, NAD NEURO/PSYCH:  Alert and oriented x 3, no focal deficits EYES:  eyes equal and reactive ENT/NECK:  no LAD, no JVD CARDIO: Regular rate, well-perfused, normal S1 and S2 PULM:  CTAB no wheezing or rhonchi GI/GU:  non-distended, non-tender MSK/SPINE:  No gross deformities, no edema SKIN:  no rash, atraumatic   *Additional and/or pertinent findings included in MDM below  Diagnostic and Interventional Summary    EKG Interpretation  Date/Time:    Ventricular Rate:    PR Interval:    QRS Duration:   QT Interval:    QTC Calculation:   R Axis:     Text Interpretation:         Labs Reviewed  PREGNANCY, URINE    DG Hip Unilat With Pelvis 2-3 Views Left  Final Result    DG Shoulder Left  Final Result    DG Elbow Complete Left  Final Result    DG FEMUR 1V LEFT  Final Result    DG Ribs Unilateral W/Chest Left  Final Result      Medications - No data to display   Procedures  /  Critical Care Procedures  ED Course and Medical Decision Making  Initial Impression and Ddx Trauma 12+ hours ago, ambulating, benign abdomen, lungs  clear, no head trauma.  Low concern for significant traumatic injuries.  Past medical/surgical history that increases complexity of ED encounter: None  Interpretation of Diagnostics I personally reviewed the chest x-ray and my interpretation is as follows: No obvious pneumothorax  Rib fracture noted, otherwise no traumatic injury  Patient Reassessment and Ultimate Disposition/Management     Discharge with pain control  Patient management required discussion with the following services or consulting groups:  None  Complexity of Problems Addressed Acute illness or injury that poses threat of life of bodily function  Additional Data Reviewed and Analyzed Further history obtained from: None  Additional Factors Impacting ED Encounter Risk Prescriptions  Elmer Sow. Pilar Plate, MD [facility]  Medicine [facility] [email or web address]  Final Clinical Impressions(s) / ED Diagnoses     ICD-10-CM   1. Closed fracture of one rib of left side, initial encounter  S22.32XA       ED Discharge Orders          Ordered    naproxen (NAPROSYN) 500 MG tablet  2 times daily        04/22/22 2339    oxyCODONE (ROXICODONE) 5 MG immediate release tablet  Every 4 hours PRN        04/22/22 2339    lidocaine (LIDODERM) 5 %  Every 24 hours        04/22/22 2339             Discharge Instructions Discussed with and Provided to Patient:    Discharge Instructions      You were evaluated in the Emergency Department and after careful evaluation, we did not find any emergent condition requiring admission or further testing in the hospital.  Your exam/testing today is overall reassuring.  X-rays show that you likely have a broken rib.  Recommend using the Naprosyn twice daily for pain.  Can also use the numbing patches daily.  Use the oxycodone for more significant pain.  Take deep breathes throughout the day.  Please return to the Emergency Department if you experience any worsening of your condition.   Thank you for allowing [place] to be a part of your care.      Sabas Sous, MD 04/22/22 [phone]

## 2022-04-22 NOTE — ED Triage Notes (Signed)
Patient here POV from Home.  Endorses Falling at 1125 today at Work into [employer]. Trap is approximately 2-3 Feet. Patient caught herself on the Floor injuring her Left Elbow, Left Torso, and Left Hip.   No Head Injury. No Anticoagulants.   NAD Noted during Triage. A&Ox4. GCS 15. Ambulatory.

## 2022-04-23 NOTE — ED Notes (Signed)
Pt agreeable with d/c plan as discussed  by provider- this nurse has verbally reinforced d/c instructions and provided pt with written copy - pt acknowledges verbal understanding and denies any additional questions, concerns, needs- ambulatory at d/c independently with steady gait escorted by fiancee- no distress

## 2022-12-10 ENCOUNTER — Emergency Department (HOSPITAL_BASED_OUTPATIENT_CLINIC_OR_DEPARTMENT_OTHER): Payer: 59

## 2022-12-10 ENCOUNTER — Emergency Department (HOSPITAL_BASED_OUTPATIENT_CLINIC_OR_DEPARTMENT_OTHER)
Admission: EM | Admit: 2022-12-10 | Discharge: 2022-12-10 | Disposition: A | Discharge status: Home / Self Care | Payer: 59 | Attending: Emergency Medicine | Admitting: Emergency Medicine

## 2022-12-10 ENCOUNTER — Other Ambulatory Visit: Payer: Self-pay

## 2022-12-10 ENCOUNTER — Encounter (HOSPITAL_BASED_OUTPATIENT_CLINIC_OR_DEPARTMENT_OTHER): Payer: Self-pay | Admitting: Emergency Medicine

## 2022-12-10 DIAGNOSIS — M722 Plantar fascial fibromatosis: Secondary | ICD-10-CM | POA: Diagnosis not present

## 2022-12-10 DIAGNOSIS — M79672 Pain in left foot: Secondary | ICD-10-CM | POA: Diagnosis not present

## 2022-12-10 LAB — CBG MONITORING, ED: Glucose-Capillary: 87 mg/dL (ref 70–99)

## 2022-12-10 MED ORDER — IBUPROFEN 600 MG PO TABS: 600.0000 mg | ORAL_TABLET | Freq: Three times a day (TID) | ORAL | 0 refills | Status: AC | PRN

## 2022-12-10 MED ORDER — OXYCODONE-ACETAMINOPHEN 5-325 MG PO TABS
1.0000 | ORAL_TABLET | Freq: Once | ORAL | Status: AC
  Administered 2022-12-10: 1 via ORAL
  Filled 2022-12-10: qty 1

## 2022-12-10 NOTE — ED Provider Notes (Signed)
[facility] Provider Note   CSN: [id number] Arrival date & time: 12/10/22  1601     History  Chief Complaint  Patient presents with   Foot Injury    Kristin Long is a 52 y.o. female presents to the ED complaining of left plantar foot pain for the past 5 to 6 weeks.  Patient states that she works long days sometimes 12+ hours at her job frequently and her pain has been gradually worsening since onset.  She reports that when she wakes up in the morning the first step that she takes of the day is the most painful.  She denies any fall or injury to the foot.  She notes a callused area to the plantar aspect just below the fifth digit that is particularly painful and she has been attempting to peel skin off this area.  She denies a history of diabetes.  She denies previously seeing a podiatrist.  She states that she has tried multiple different shoes and inserts without relief of her symptoms.  She has not taken any medications at home.      Home Medications Prior to Admission medications   Medication Sig Start Date End Date Taking? Authorizing Provider  ibuprofen (ADVIL) 600 MG tablet Take 1 tablet (600 mg total) by mouth every 8 (eight) hours as needed for up to 5 days for mild pain or moderate pain. 12/10/22 12/15/22 Yes Dior Stepter L, PA-C  lidocaine (LIDODERM) 5 % Place 1 patch onto the skin daily. Remove & Discard patch within 12 hours or as directed by MD 04/22/22   Maudie Flakes, MD  metroNIDAZOLE (FLAGYL) 500 MG tablet Take 2 tablets with dinner and 2 at bedtime with food 07/24/19   Huel Cote, NP  naproxen (NAPROSYN) 500 MG tablet Take 1 tablet (500 mg total) by mouth 2 (two) times daily. 04/22/22   Maudie Flakes, MD  oxyCODONE (ROXICODONE) 5 MG immediate release tablet Take 1 tablet (5 mg total) by mouth every 4 (four) hours as needed for severe pain. 04/22/22   Maudie Flakes, MD  VITAMIN E PO Take 1 tablet by mouth daily.     [provider]      Allergies    Patient has no known allergies.    Review of Systems   Review of Systems  Constitutional:  Negative for chills and fever.  HENT:  Negative for ear pain and sore throat.   Eyes:  Negative for pain and visual disturbance.  Respiratory:  Negative for cough and shortness of breath.   Cardiovascular:  Negative for chest pain and palpitations.  Gastrointestinal:  Negative for abdominal pain and vomiting.  Genitourinary:  Negative for dysuria and hematuria.  Musculoskeletal:  Negative for arthralgias and back pain.  Skin:  Negative for color change and rash.  Neurological:  Negative for seizures and syncope.  All other systems reviewed and are negative.   Physical Exam Updated Vital Signs BP (!) 155/110 (BP Location: Right Arm)   Pulse 100   Temp 97.8 F (36.6 C)   Resp 18   Ht '5\' 5"'$  (1.651 m)   Wt 54.9 kg   SpO2 98%   BMI 20.14 kg/m  Physical Exam Vitals and nursing note reviewed.  Constitutional:      General: She is not in acute distress.    Appearance: Normal appearance. She is not ill-appearing or toxic-appearing.  HENT:     Head: Normocephalic and atraumatic.  Mouth/Throat:     Mouth: Mucous membranes are moist.  Eyes:     Conjunctiva/sclera: Conjunctivae normal.  Cardiovascular:     Rate and Rhythm: Normal rate and regular rhythm.     Heart sounds: No murmur heard. Pulmonary:     Effort: Pulmonary effort is normal.     Breath sounds: Normal breath sounds.  Abdominal:     General: Abdomen is flat.     Palpations: Abdomen is soft.     Tenderness: There is no abdominal tenderness.  Musculoskeletal:        General: Normal range of motion.     Cervical back: Neck supple.     Right lower leg: No edema.     Left lower leg: No edema.     Comments: Diffuse tenderness to plantar surface of left foot, area of thickened skin at the base of the fifth digit, no surrounding or other erythema, increased warmth, open wounds or  drainage noted to the foot or ankle, foot is warm and appears to be well-perfused, 2+ PT DP pulses, soft compartments, normal sensation, normal range of motion  Skin:    General: Skin is warm and dry.     Capillary Refill: Capillary refill takes less than 2 seconds.  Neurological:     Mental Status: She is alert. Mental status is at baseline.  Psychiatric:        Behavior: Behavior normal.     ED Results / Procedures / Treatments   Labs (all labs ordered are listed, but only abnormal results are displayed) Labs Reviewed  CBG MONITORING, ED    EKG None  Radiology DG Foot Complete Left  Result Date: 12/10/2022 CLINICAL DATA:  Left foot pain over the last 5 weeks EXAM: LEFT FOOT - COMPLETE 3+ VIEW COMPARISON:  None Available. FINDINGS: No fracture or malalignment observed. No discrete periosteal reaction or specific radiographic abnormality to explain the patient's foot pain. IMPRESSION: 1. No significant abnormality identified. If pain persists despite conservative therapy, MRI may be warranted for further characterization. Electronically Signed   By: Van Clines M.D.   On: 12/10/2022 17:25    Procedures Procedures    Medications Ordered in ED Medications  oxyCODONE-acetaminophen (PERCOCET/ROXICET) 5-325 MG per tablet 1 tablet (1 tablet Oral Given 12/10/22 1745)    ED Course/ Medical Decision Making/ A&P                             Medical Decision Making Amount and/or Complexity of Data Reviewed Radiology: ordered. Decision-making details documented in ED Course.  Risk Prescription drug management.   Medical Decision Making:   Kristin Long is a 52 y.o. female who presented to the ED today with left foot pain detailed above.    Patient's presentation is complicated by their history of frequent stress to the foot.  Complete initial physical exam performed, notably the patient was with tenderness diffusely over the plantar aspect of the left foot but was  neurovascularly intact with soft compartments normal range of motion.  There was an area of thickened skin at the base of the fifth metatarsal which appears consistent with a callus but no open wounds or other skin changes.  Range of motion is intact. Reviewed and confirmed nursing documentation for past medical history, family history, social history.    Initial Assessment:   With the patient's presentation of left foot pain, most likely diagnosis is plantar fasciitis. Differential diagnosis includes but is  not limited to fracture, dislocation, compartment syndrome, cellulitis, abscess, diabetic foot infection, PAD, neuropathy. This is most consistent with an acute complicated illness  Initial Plan:  XR to evaluate for bony or other pathology Pain management CBG to check glucose Objective evaluation as reviewed   Initial Study Results:    Radiology:  All images reviewed independently. Agree with radiology report at this time.   DG Foot Complete Left  Result Date: 12/10/2022 CLINICAL DATA:  Left foot pain over the last 5 weeks EXAM: LEFT FOOT - COMPLETE 3+ VIEW COMPARISON:  None Available. FINDINGS: No fracture or malalignment observed. No discrete periosteal reaction or specific radiographic abnormality to explain the patient's foot pain. IMPRESSION: 1. No significant abnormality identified. If pain persists despite conservative therapy, MRI may be warranted for further characterization. Electronically Signed   By: Van Clines M.D.   On: 12/10/2022 17:25       Final Assessment and Plan:   This is a 52 year old female presenting to the ED complaining of left plantar foot pain.  Symptoms as described above but most notably she states that she has been more active with increasing amounts of weightbearing lately at her job.  She states that the first step of the day is the most painful.  Overall, examination is reassuring and she has intact range of motion and is neurovascularly intact  distally without signs of deformity or significant wounds.  Small callused area noted to the base of the fifth metatarsal but no open wounds or signs of localized infection.  Patient symptoms most consistent with plantar fasciitis.  X-ray negative as above.  Discussed findings with patient as well as recommendation to continue with good foot wear, inserts, and follow-up with podiatry for any continued symptoms if she does not notice significant improvement with rest over the next few days and NSAIDs at home.  Patient expressed understanding of plan.  Strict ED return precautions given, all questions answered, patient stable for discharge.   Clinical Impression:  1. Plantar fasciitis of left foot      Discharge           Final Clinical Impression(s) / ED Diagnoses Final diagnoses:  Plantar fasciitis of left foot    Rx / DC Orders ED Discharge Orders          Ordered    ibuprofen (ADVIL) 600 MG tablet  Every 8 hours PRN        12/10/22 1801              Suzzette Righter, PA-C 12/10/22 1827    Malvin Johns, MD 12/10/22 1900

## 2022-12-10 NOTE — ED Triage Notes (Signed)
Left foot pain hurts to walk x 5 weeks

## 2022-12-10 NOTE — ED Notes (Signed)
Pt verbalized understanding of d/c instructions, meds, and followup care. Denies questions. VSS, no distress noted. Steady gait to exit with all belongings. 

## 2022-12-10 NOTE — Discharge Instructions (Signed)
The x-ray of your foot was negative.  Your symptoms are consistent with plantars fasciitis.  You are doing the right things by wearing supportive shoes and inserts.  Please continue to do this.  I am also giving you a few days off of work to rest and recover.  Plantar fasciitis typically responds well to the use of NSAIDs.  I prescribed you this medication to take at home.  I recommend that you take it routinely over the next 2 to 3 days to help with pain and inflammation and after that time you may take it as needed.  It is best to take this medication with food as it can be harsh on the stomach.  You may take Tylenol on top of this as needed for any breakthrough pain.  If you continue to have symptoms, please follow-up with your PCP.  If you do not have a PCP, I provided the names of 2 outpatient clinics that you may contact to arrange a follow-up appointment.  I have also provided the information for podiatry or the foot specialist who you may follow-up with for any continued symptoms.  For any new or worsening, emergent condition, please return to the nearest emergency department for reevaluation.

## 2023-03-13 ENCOUNTER — Emergency Department (HOSPITAL_BASED_OUTPATIENT_CLINIC_OR_DEPARTMENT_OTHER)
Admission: EM | Admit: 2023-03-13 | Discharge: 2023-03-13 | Disposition: A | Discharge status: Home / Self Care | Payer: 59 | Attending: Emergency Medicine | Admitting: Emergency Medicine

## 2023-03-13 ENCOUNTER — Other Ambulatory Visit: Payer: Self-pay

## 2023-03-13 ENCOUNTER — Emergency Department (HOSPITAL_BASED_OUTPATIENT_CLINIC_OR_DEPARTMENT_OTHER): Payer: 59

## 2023-03-13 ENCOUNTER — Encounter (HOSPITAL_BASED_OUTPATIENT_CLINIC_OR_DEPARTMENT_OTHER): Payer: Self-pay | Admitting: Emergency Medicine

## 2023-03-13 DIAGNOSIS — K0510 Chronic gingivitis, plaque induced: Secondary | ICD-10-CM

## 2023-03-13 DIAGNOSIS — E871 Hypo-osmolality and hyponatremia: Secondary | ICD-10-CM | POA: Insufficient documentation

## 2023-03-13 DIAGNOSIS — K0500 Acute gingivitis, plaque induced: Secondary | ICD-10-CM | POA: Diagnosis not present

## 2023-03-13 DIAGNOSIS — K0889 Other specified disorders of teeth and supporting structures: Secondary | ICD-10-CM

## 2023-03-13 DIAGNOSIS — K029 Dental caries, unspecified: Secondary | ICD-10-CM | POA: Diagnosis not present

## 2023-03-13 LAB — CBC WITH DIFFERENTIAL/PLATELET
Abs Immature Granulocytes: 0.02 10*3/uL (ref 0.00–0.07)
Basophils Absolute: 0 10*3/uL (ref 0.0–0.1)
Basophils Relative: 1 %
Eosinophils Absolute: 0 10*3/uL (ref 0.0–0.5)
Eosinophils Relative: 1 %
HCT: 38.6 % (ref 36.0–46.0)
Hemoglobin: 13.1 g/dL (ref 12.0–15.0)
Immature Granulocytes: 0 %
Lymphocytes Relative: 26 %
Lymphs Abs: 1.7 10*3/uL (ref 0.7–4.0)
MCH: 30.8 pg (ref 26.0–34.0)
MCHC: 33.9 g/dL (ref 30.0–36.0)
MCV: 90.8 fL (ref 80.0–100.0)
Monocytes Absolute: 0.9 10*3/uL (ref 0.1–1.0)
Monocytes Relative: 14 %
Neutro Abs: 3.8 10*3/uL (ref 1.7–7.7)
Neutrophils Relative %: 58 %
Platelets: 204 10*3/uL (ref 150–400)
RBC: 4.25 MIL/uL (ref 3.87–5.11)
RDW: 13 % (ref 11.5–15.5)
WBC: 6.5 10*3/uL (ref 4.0–10.5)
nRBC: 0 % (ref 0.0–0.2)

## 2023-03-13 LAB — BASIC METABOLIC PANEL
Anion gap: 8 (ref 5–15)
BUN: 11 mg/dL (ref 6–20)
CO2: 26 mmol/L (ref 22–32)
Calcium: 9.6 mg/dL (ref 8.9–10.3)
Chloride: 100 mmol/L (ref 98–111)
Creatinine, Ser: 0.77 mg/dL (ref 0.44–1.00)
GFR, Estimated: >60 mL/min (ref >60)
Glucose, Bld: 105 mg/dL — ABNORMAL HIGH (ref 70–99)
Potassium: 3.7 mmol/L (ref 3.5–5.1)
Sodium: 134 mmol/L — ABNORMAL LOW (ref 135–145)

## 2023-03-13 MED ORDER — KETOROLAC TROMETHAMINE 15 MG/ML IJ SOLN
30.0000 mg | Freq: Once | INTRAMUSCULAR | Status: AC
  Administered 2023-03-13: 30 mg via INTRAVENOUS
  Filled 2023-03-13: qty 2

## 2023-03-13 MED ORDER — AMOXICILLIN-POT CLAVULANATE 875-125 MG PO TABS: 1.0000 | ORAL_TABLET | Freq: Two times a day (BID) | ORAL | 0 refills | Status: DC

## 2023-03-13 MED ORDER — METRONIDAZOLE 500 MG PO TABS: 500.0000 mg | ORAL_TABLET | Freq: Three times a day (TID) | ORAL | 0 refills | Status: AC

## 2023-03-13 MED ORDER — IOHEXOL 300 MG/ML  SOLN
100.0000 mL | Freq: Once | INTRAMUSCULAR | Status: AC | PRN
  Administered 2023-03-13: 75 mL via INTRAVENOUS

## 2023-03-13 MED ORDER — ONDANSETRON 4 MG PO TBDP: 4.0000 mg | ORAL_TABLET | Freq: Three times a day (TID) | ORAL | 0 refills | Status: AC | PRN

## 2023-03-13 MED ORDER — CHLORHEXIDINE GLUCONATE 0.12% ORAL RINSE (MEDLINE KIT): 15.0000 mL | Freq: Two times a day (BID) | OROMUCOSAL | 0 refills | Status: AC

## 2023-03-13 MED ORDER — METRONIDAZOLE 500 MG PO TABS
500.0000 mg | ORAL_TABLET | Freq: Once | ORAL | Status: AC
Start: 2023-03-13 — End: 2023-03-13
  Administered 2023-03-13: 500 mg via ORAL
  Filled 2023-03-13: qty 1

## 2023-03-13 MED ORDER — AMOXICILLIN-POT CLAVULANATE 875-125 MG PO TABS
1.0000 | ORAL_TABLET | Freq: Once | ORAL | Status: AC
  Administered 2023-03-13: 1 via ORAL
  Filled 2023-03-13: qty 1

## 2023-03-13 NOTE — ED Triage Notes (Signed)
Pt has left sided dental pain with facial swelling. Temp 101 at home, did not take meds.

## 2023-03-13 NOTE — ED Provider Notes (Signed)
[place] EMERGENCY DEPARTMENT AT [facility] Emergency Dept Provider Note   CSN: [id number] Arrival date & time: 03/13/23  4010     History  Chief Complaint  Patient presents with   Dental Pain    Kristin Long is a 52 y.o. female.  Smoker with no significant past medical history who presents with dental pain and swelling on the left side of her face.  She says it has been worsening over the past 3 to 4 days.  She has not been eating foods on the left side due to pain.  She has been eating foods on the right side but eating less due to the pain and swelling as well as drinking less.  She has had no drooling or difficulty swallowing.  No difficulty breathing, no vomiting, no neck swelling.  Has had this happen before and had to have tooth pulled.  She does have dental insurance.  She does not remember cracking a tooth.  She did have a fever of 101 at home.  She is complaining of pain in the left side of her face in the upper and bottom teeth as well as associated swelling.   Dental Pain      Home Medications Prior to Admission medications   Medication Sig Start Date End Date Taking? Authorizing Provider  amoxicillin-clavulanate (AUGMENTIN) 875-125 MG tablet Take 1 tablet by mouth every 12 (twelve) hours. 03/13/23  Yes Mardene Sayer, MD  chlorhexidine gluconate, MEDLINE KIT, (PERIDEX) 0.12 % solution Use as directed 15 mLs in the mouth or throat 2 (two) times daily. 03/13/23  Yes Mardene Sayer, MD  metroNIDAZOLE (FLAGYL) 500 MG tablet Take 1 tablet (500 mg total) by mouth 3 (three) times daily for 7 days. 03/13/23 03/20/23 Yes Mardene Sayer, MD  ondansetron (ZOFRAN-ODT) 4 MG disintegrating tablet Take 1 tablet (4 mg total) by mouth every 8 (eight) hours as needed for vomiting or nausea. 03/13/23  Yes Mardene Sayer, MD  lidocaine (LIDODERM) 5 % Place 1 patch onto the skin daily. Remove & Discard patch within 12 hours or as directed by MD 04/22/22   Sabas Sous, MD  naproxen  (NAPROSYN) 500 MG tablet Take 1 tablet (500 mg total) by mouth 2 (two) times daily. 04/22/22   Sabas Sous, MD  oxyCODONE (ROXICODONE) 5 MG immediate release tablet Take 1 tablet (5 mg total) by mouth every 4 (four) hours as needed for severe pain. 04/22/22   Sabas Sous, MD  VITAMIN E PO Take 1 tablet by mouth daily.    [provider]      Allergies    Patient has no known allergies.    Review of Systems   Review of Systems  Physical Exam Updated Vital Signs BP (!) 146/74   Pulse 73   Temp 98.2 F (36.8 C) (Oral)   Resp 20   Ht 5\' 5"  (1.651 m)   Wt 65.8 kg   SpO2 100%   BMI 24.13 kg/m  Physical Exam Constitutional: Alert and oriented.  Laying in bed with blanket over her head but no acute distress nontoxic Eyes: Conjunctivae are normal. ENT      Head: Normocephalic and atraumatic.      Mouth/Throat: Left maxillary and buccal space swelling.  Uvula is midline.  No oropharyngeal erythema or exudate.  No tongue raising.  No submental swelling or submandibular swelling.  There is tenderness and edema overlying the left maxillary region as well as diffuse dental caries, missing teeth  and trench mouth.  No obvious dental fracture.  Tenderness over the left posterior maxillary and mandibular molars. Associated erythema and skin breakdown within the maxillary cheek on the left.      Neck: No stridor. No drooling, no tripoding. Cardiovascular: Regular rate Respiratory: Normal respiratory effort. Breath sounds are normal.  O2 sat 99 on RA Gastrointestinal: Nondistended Musculoskeletal: Normal range of motion in all extremities. Neurologic: Normal speech and language.  CN II through XII grossly intact.  No facial droop.  Tongue midline.  No gross focal neurologic deficits are appreciated. Skin: Skin is warm, dry and intact. No rash noted. Psychiatric: Mood and affect are normal. Speech and behavior are normal.  ED Results / Procedures / Treatments   Labs (all labs  ordered are listed, but only abnormal results are displayed) Labs Reviewed  BASIC METABOLIC PANEL - Abnormal; Notable for the following components:      Result Value   Sodium 134 (*)    Glucose, Bld 105 (*)    All other components within normal limits  CBC WITH DIFFERENTIAL/PLATELET    EKG None  Radiology CT Maxillofacial W Contrast  Result Date: 03/13/2023 CLINICAL DATA:  Left-sided dental pain with facial swelling EXAM: CT MAXILLOFACIAL WITH CONTRAST TECHNIQUE: Multidetector CT imaging of the maxillofacial structures was performed with intravenous contrast. Multiplanar CT image reconstructions were also generated. RADIATION DOSE REDUCTION: This exam was performed according to the departmental dose-optimization program which includes automated exposure control, adjustment of the mA and/or kV according to patient size and/or use of iterative reconstruction technique. CONTRAST:  75mL OMNIPAQUE IOHEXOL 300 MG/ML  SOLN COMPARISON:  None Available. FINDINGS: Osseous: The remaining teeth show extensive caries with numerous periapical erosions, especially involving the premolar and molar teeth of both arches. Most notable is the remaining left upper molar which has a large periapical erosion and buccal sided dehiscence with fat infiltration but no collection. No floor of mouth swelling. Negative for fracture or bone lesion Orbits: Negative Sinuses: Mild mucosal thickening along the floors of the maxillary sinuses Soft tissues: As above Limited intracranial: Negative IMPRESSION: Advanced dental caries most notably affecting tooth 16 where there is a dehiscent periapical erosion with regional soft tissue inflammation. No collection. Electronically Signed   By: Tiburcio Pea M.D.   On: 03/13/2023 10:10    Procedures Procedures    Medications Ordered in ED Medications  amoxicillin-clavulanate (AUGMENTIN) 875-125 MG per tablet 1 tablet (1 tablet Oral Given 03/13/23 0826)  metroNIDAZOLE (FLAGYL) tablet  500 mg (500 mg Oral Given 03/13/23 0826)  ketorolac (TORADOL) 15 MG/ML injection 30 mg (30 mg Intravenous Given 03/13/23 0826)  iohexol (OMNIPAQUE) 300 MG/ML solution 100 mL (75 mLs Intravenous Contrast Given 03/13/23 0935)    ED Course/ Medical Decision Making/ A&P Clinical Course as of 03/13/23 1841  Sat Mar 13, 2023  0927 Labs reviewed by me normal white blood cell count 6.5 no anemia 13.1 hemoglobin.  Glucose 105.  Creatinine 0.77 within normal limits.  Mild hyponatremia 134. [VB]    Clinical Course User Index [VB] Mardene Sayer, MD                             Medical Decision Making Kristin Long is a 52 y.o. female.  Smoker with no significant past medical history who presents with dental pain and swelling on the left side of her face.   Patient has pain in the left posterior maxillary molars as well  as the mandibular posterior molar.  There is no obvious periapical abscess.  She has a associated buccal induration but no fluctuance suggestive of abscess.  Her exam is concerning for acute necrotizing ulcerative gingivitis.  She has significant dental caries.  Her exam is not consistent clinically with Ludwigs angina.  Labs reviewed by me normal white blood cell count 6.5 no anemia 13.1 hemoglobin.  Glucose 105.  Creatinine 0.77 within normal limits.  Mild hyponatremia 134. [  CT face with contrast obtained to rule out abscess which I personally reviewed showing no evidence of abscess however evidence of advanced dental caries especially tooth 16 with associated periapical erosion.  To cover for acute necrotizing ulcerative gingivitis, started patient on Augmentin and Flagyl nd chlorhexidine rinses.  She does have dental insurance and will call her dentist for follow-up.  Advised continue Tylenol ibuprofen for pain control.  Strict return precaution discussed.  She is in agreement with plan.   Amount and/or Complexity of Data Reviewed Labs: ordered. Radiology:  ordered.  Risk Prescription drug management.      Final Clinical Impression(s) / ED Diagnoses Final diagnoses:  Pain, dental  Ulcerative gingivitis    Rx / DC Orders ED Discharge Orders          Ordered    chlorhexidine gluconate, MEDLINE KIT, (PERIDEX) 0.12 % solution  2 times daily        03/13/23 1043    amoxicillin-clavulanate (AUGMENTIN) 875-125 MG tablet  Every 12 hours        03/13/23 1043    metroNIDAZOLE (FLAGYL) 500 MG tablet  3 times daily        03/13/23 1043    ondansetron (ZOFRAN-ODT) 4 MG disintegrating tablet  Every 8 hours PRN        03/13/23 1043              Mardene Sayer, MD 03/13/23 [phone]

## 2023-03-13 NOTE — Discharge Instructions (Addendum)
You have been seen in the Emergency Department (ED)  today for dental pain.  Your scan showed no evidence of abscess.  Please take your prescribed antibiotics-metronidazole and Augmentin. Do NOT take with alcohol. You may take Tylenol and ibuprofen for pain and fever.  Use the mouthwash as prescribed.  Use the Zofran as needed for nausea or vomiting.  Please call the numbers provided to you and make a dental appointment as soon as possible.  Only a dentist will be able to fix your problem(s).  Return to the ED  if you develop worsening pain, fever, pus/drainage, difficulty breathing, or other symptoms that concern you.

## 2023-03-13 NOTE — ED Notes (Signed)
Left side of patient's face is swollen x 1 day

## 2024-05-16 ENCOUNTER — Encounter (HOSPITAL_BASED_OUTPATIENT_CLINIC_OR_DEPARTMENT_OTHER): Payer: Self-pay

## 2024-05-16 ENCOUNTER — Other Ambulatory Visit: Payer: Self-pay

## 2024-05-16 ENCOUNTER — Emergency Department (HOSPITAL_BASED_OUTPATIENT_CLINIC_OR_DEPARTMENT_OTHER)
Admission: EM | Admit: 2024-05-16 | Discharge: 2024-05-16 | Disposition: A | Discharge status: Home / Self Care | Attending: Emergency Medicine | Admitting: Emergency Medicine

## 2024-05-16 DIAGNOSIS — K0889 Other specified disorders of teeth and supporting structures: Secondary | ICD-10-CM | POA: Diagnosis present

## 2024-05-16 DIAGNOSIS — Z720 Tobacco use: Secondary | ICD-10-CM | POA: Diagnosis not present

## 2024-05-16 DIAGNOSIS — K047 Periapical abscess without sinus: Secondary | ICD-10-CM | POA: Insufficient documentation

## 2024-05-16 MED ORDER — AMOXICILLIN-POT CLAVULANATE 875-125 MG PO TABS: 1.0000 | ORAL_TABLET | Freq: Two times a day (BID) | ORAL | 0 refills | Status: DC

## 2024-05-16 MED ORDER — NAPROXEN 500 MG PO TABS: 500.0000 mg | ORAL_TABLET | Freq: Two times a day (BID) | ORAL | 0 refills | Status: DC

## 2024-05-16 MED ORDER — AMOXICILLIN-POT CLAVULANATE 875-125 MG PO TABS
1.0000 | ORAL_TABLET | Freq: Once | ORAL | Status: AC
  Administered 2024-05-16 (×2): 1 via ORAL
  Filled 2024-05-16: qty 1

## 2024-05-16 MED ORDER — KETOROLAC TROMETHAMINE 15 MG/ML IJ SOLN
15.0000 mg | Freq: Once | INTRAMUSCULAR | Status: AC
  Administered 2024-05-16 (×2): 15 mg via INTRAMUSCULAR
  Filled 2024-05-16: qty 1

## 2024-05-16 NOTE — Discharge Instructions (Addendum)
 It was a pleasure taking care of you today.  As discussed, I am sending you home with antibiotics and pain medication.  I have also included dental resources in the community.  Please call to schedule an appointment for further evaluation.  Return to the ER for any worsening symptoms.

## 2024-05-16 NOTE — ED Provider Notes (Signed)
  EMERGENCY DEPARTMENT AT [facility] Provider Note   CSN: [id number] Arrival date & time: 05/16/24  1232     Patient presents with: Facial Swelling   Kristin Long is a 53 y.o. female with a past medical history significant for tobacco abuse who presents to the ED due to left-sided facial edema.  Patient admits to left-sided dental pain.  Facial edema started yesterday.  Denies any difficulty swallowing or breathing.  Saw a dentist last year however, has not returned for further dental work.  No fever or chills.  History obtained from patient and past medical records. No interpreter used during encounter.      Prior to Admission medications   Medication Sig Start Date End Date Taking? Authorizing Provider  amoxicillin -clavulanate (AUGMENTIN ) 875-125 MG tablet Take 1 tablet by mouth every 12 (twelve) hours. 05/16/24  Yes Oluwatoyin Banales C, PA-C  naproxen  (NAPROSYN ) 500 MG tablet Take 1 tablet (500 mg total) by mouth 2 (two) times daily. 05/16/24  Yes Demarques Pilz C, PA-C  amoxicillin -clavulanate (AUGMENTIN ) 875-125 MG tablet Take 1 tablet by mouth every 12 (twelve) hours. 03/13/23   Ethyl Richerd BROCKS, MD  chlorhexidine  gluconate, MEDLINE KIT, (PERIDEX ) 0.12 % solution Use as directed 15 mLs in the mouth or throat 2 (two) times daily. 03/13/23   Ethyl Richerd BROCKS, MD  lidocaine  (LIDODERM ) 5 % Place 1 patch onto the skin daily. Remove & Discard patch within 12 hours or as directed by MD 04/22/22   Theadore Ozell HERO, MD  naproxen  (NAPROSYN ) 500 MG tablet Take 1 tablet (500 mg total) by mouth 2 (two) times daily. 04/22/22   Theadore Ozell HERO, MD  ondansetron  (ZOFRAN -ODT) 4 MG disintegrating tablet Take 1 tablet (4 mg total) by mouth every 8 (eight) hours as needed for vomiting or nausea. 03/13/23   Ethyl Richerd BROCKS, MD  oxyCODONE  (ROXICODONE ) 5 MG immediate release tablet Take 1 tablet (5 mg total) by mouth every 4 (four) hours as needed for severe pain. 04/22/22   Theadore Ozell HERO, MD  VITAMIN E PO Take 1 tablet by mouth daily.    [provider]    Allergies: Patient has no known allergies.    Review of Systems  Constitutional:  Negative for fever.  HENT:  Positive for dental problem and facial swelling. Negative for trouble swallowing and voice change.     Updated Vital Signs BP (!) 135/97 (BP Location: Right Arm)   Pulse (!) 106   Temp 98.1 F (36.7 C) (Oral)   Resp 16   Ht 5' 5 (1.651 m)   Wt 66.7 kg   SpO2 98%   BMI 24.46 kg/m   Physical Exam Vitals and nursing note reviewed.  Constitutional:      General: She is not in acute distress.    Appearance: She is not ill-appearing.  HENT:     Head: Normocephalic.     Mouth/Throat:     Comments: Slight edema to left cheek. Poor dentition on left side. No drainable abscess. Tolerating oral secretions without difficulty.  Eyes:     Pupils: Pupils are equal, round, and reactive to light.  Cardiovascular:     Rate and Rhythm: Normal rate and regular rhythm.     Pulses: Normal pulses.     Heart sounds: Normal heart sounds. No murmur heard.    No friction rub. No gallop.  Pulmonary:     Effort: Pulmonary effort is normal.     Breath sounds: Normal breath sounds.  Abdominal:     General: Abdomen is flat. There is no distension.     Palpations: Abdomen is soft.     Tenderness: There is no abdominal tenderness. There is no guarding or rebound.  Musculoskeletal:        General: Normal range of motion.     Cervical back: Neck supple.  Skin:    General: Skin is warm and dry.  Neurological:     General: No focal deficit present.     Mental Status: She is alert.  Psychiatric:        Mood and Affect: Mood normal.        Behavior: Behavior normal.     (all labs ordered are listed, but only abnormal results are displayed) Labs Reviewed - No data to display  EKG: None  Radiology: No results found.   Procedures   Medications Ordered in the ED  ketorolac  (TORADOL ) 15 MG/ML  injection 15 mg (has no administration in time range)  amoxicillin -clavulanate (AUGMENTIN ) 875-125 MG per tablet 1 tablet (has no administration in time range)                                    Medical Decision Making Risk Prescription drug management.   53 year old female presents to the ED due to left-sided facial edema that started yesterday.  Also endorses some dental pain.  No fever or chills.  No difficulty swallowing or speaking.  Upon arrival stable vitals.  Triage note patient to be tachycardic 106 however, during initial evaluation heart rate in the 80s.  Patient in no acute distress.  Slight edema to left cheek.  Poor dentition on left side.  No drainable abscess.  Tongue in normal position without protrusion. Tolerating oral secretions without difficulty. Low suspicion for ludwig's or deep space infection.  Suspect symptoms likely related to dental infection.  Airway patent.  No signs of respiratory distress.  Patient given first dose of Augmentin  here in the ED and Toradol  for pain management.  Discharged with antibiotics.  Dental resources given.  Patient stable for discharge. Strict ED precautions discussed with patient. Patient states understanding and agrees to plan. Patient discharged home in no acute distress and stable vitals      Final diagnoses:  Dental infection    ED Discharge Orders          Ordered    amoxicillin -clavulanate (AUGMENTIN ) 875-125 MG tablet  Every 12 hours        05/16/24 1340    naproxen  (NAPROSYN ) 500 MG tablet  2 times daily        05/16/24 1340               Shantera Monts C, PA-C 05/16/24 1448    Randol Simmonds, MD 05/17/24 [phone]

## 2024-05-16 NOTE — ED Triage Notes (Signed)
 Swelling to left side of face onset yesterday. Pain started today.

## 2024-05-17 ENCOUNTER — Telehealth (HOSPITAL_BASED_OUTPATIENT_CLINIC_OR_DEPARTMENT_OTHER): Payer: Self-pay | Admitting: Emergency Medicine

## 2024-05-17 MED ORDER — AMOXICILLIN-POT CLAVULANATE 875-125 MG PO TABS: 1.0000 | ORAL_TABLET | Freq: Two times a day (BID) | ORAL | 0 refills | Status: AC

## 2024-05-17 MED ORDER — NAPROXEN 500 MG PO TABS: 500.0000 mg | ORAL_TABLET | Freq: Two times a day (BID) | ORAL | 0 refills | Status: AC

## 2024-05-17 NOTE — Telephone Encounter (Cosign Needed)
 Patient seen yesterday.  Requesting updated pharmacy for medicines to be sent to.  Will give pharmacy.
